# Patient Record
Sex: Female | Born: 1990 | Race: White | Hispanic: No | Marital: Married | Smoking: Never smoker
Health system: Southern US, Community
[De-identification: ages and names within clinical notes are randomized; demographics above are authoritative.]

## PROBLEM LIST (undated history)

## (undated) DIAGNOSIS — E282 Polycystic ovarian syndrome: Secondary | ICD-10-CM

## (undated) DIAGNOSIS — R7989 Other specified abnormal findings of blood chemistry: Secondary | ICD-10-CM

## (undated) DIAGNOSIS — T7840XA Allergy, unspecified, initial encounter: Secondary | ICD-10-CM

## (undated) DIAGNOSIS — N912 Amenorrhea, unspecified: Secondary | ICD-10-CM

## (undated) DIAGNOSIS — D352 Benign neoplasm of pituitary gland: Secondary | ICD-10-CM

## (undated) HISTORY — DX: Benign neoplasm of pituitary gland: D35.2

## (undated) HISTORY — PX: WISDOM TOOTH EXTRACTION: SHX21

## (undated) HISTORY — PX: NO PAST SURGERIES: SHX2092

## (undated) HISTORY — DX: Amenorrhea, unspecified: N91.2

## (undated) HISTORY — DX: Polycystic ovarian syndrome: E28.2

## (undated) HISTORY — DX: Allergy, unspecified, initial encounter: T78.40XA

## (undated) HISTORY — DX: Other specified abnormal findings of blood chemistry: R79.89

---

## 2020-03-01 ENCOUNTER — Other Ambulatory Visit: Payer: Self-pay

## 2020-03-01 ENCOUNTER — Other Ambulatory Visit (INDEPENDENT_AMBULATORY_CARE_PROVIDER_SITE_OTHER): Payer: BC Managed Care – PPO

## 2020-03-01 ENCOUNTER — Ambulatory Visit: Payer: BC Managed Care – PPO | Admitting: Certified Nurse Midwife

## 2020-03-01 ENCOUNTER — Encounter: Payer: Self-pay | Admitting: Certified Nurse Midwife

## 2020-03-01 VITALS — BP 121/80 | HR 69 | Ht 63.0 in | Wt 134.2 lb

## 2020-03-01 DIAGNOSIS — Z793 Long term (current) use of hormonal contraceptives: Secondary | ICD-10-CM

## 2020-03-01 DIAGNOSIS — L659 Nonscarring hair loss, unspecified: Secondary | ICD-10-CM

## 2020-03-01 DIAGNOSIS — N912 Amenorrhea, unspecified: Secondary | ICD-10-CM | POA: Diagnosis not present

## 2020-03-01 LAB — POCT URINE PREGNANCY: Preg Test, Ur: NEGATIVE

## 2020-03-01 NOTE — Progress Notes (Signed)
GYN ENCOUNTER NOTE  Subjective:       Vanessa Cordova is a 29 y.o. No obstetric history on file. female is here for gynecologic evaluation of the following issues:  1. Amenorrhea, she was on the pill since age 51 for heavy painful periods. She stopped in November because is planning to become pregnant in the future and wanted to establish a normal cycle. She denies any history of PCOS , endometriosis.  She has not had a period since stopping the birth control pill. She states she did not have a period while on the pill. She has noticed since stopping that she has had some hair loss, bloating,and  mood changes.    Gynecologic History No LMP recorded (lmp unknown). Contraception: none, was on the pill  Last Pap: 52yrs ago per pt Results were: normal Last mammogram: n/a .   Obstetric History OB History  Gravida Para Term Preterm AB Living  0 0 0 0 0 0  SAB TAB Ectopic Multiple Live Births  0 0 0 0 0    History reviewed. No pertinent past medical history.  History reviewed. No pertinent surgical history.  No current outpatient medications on file prior to visit.   No current facility-administered medications on file prior to visit.    Allergies  Allergen Reactions  . Codeine     Social History   Socioeconomic History  . Marital status: Married    Spouse name: Not on file  . Number of children: Not on file  . Years of education: Not on file  . Highest education level: Not on file  Occupational History  . Not on file  Tobacco Use  . Smoking status: Never Smoker  . Smokeless tobacco: Never Used  Substance and Sexual Activity  . Alcohol use: Not Currently  . Drug use: Not Currently  . Sexual activity: Yes    Birth control/protection: None  Other Topics Concern  . Not on file  Social History Narrative  . Not on file   Social Determinants of Health   Financial Resource Strain:   . Difficulty of Paying Living Expenses:   Food Insecurity:   . Worried About Community education officer in the Last Year:   . Barista in the Last Year:   Transportation Needs:   . Freight forwarder (Medical):   Marland Kitchen Lack of Transportation (Non-Medical):   Physical Activity:   . Days of Exercise per Week:   . Minutes of Exercise per Session:   Stress:   . Feeling of Stress :   Social Connections:   . Frequency of Communication with Friends and Family:   . Frequency of Social Gatherings with Friends and Family:   . Attends Religious Services:   . Active Member of Clubs or Organizations:   . Attends Banker Meetings:   Marland Kitchen Marital Status:   Intimate Partner Violence:   . Fear of Current or Ex-Partner:   . Emotionally Abused:   Marland Kitchen Physically Abused:   . Sexually Abused:     History reviewed. No pertinent family history.  The following portions of the patient's history were reviewed and updated as appropriate: allergies, current medications, past family history, past medical history, past social history, past surgical history and problem list.  Review of Systems Review of Systems - Negative except as noted in HPI Review of Systems - General ROS: negative for - chills, fatigue, fever, hot flashes, malaise or night sweats Hematological and Lymphatic ROS: negative for -  bleeding problems or swollen lymph nodes Gastrointestinal ROS: negative for - abdominal pain, blood in stools, change in bowel habits and nausea/vomiting Musculoskeletal ROS: negative for - joint pain, muscle pain or muscular weakness Genito-Urinary ROS: negative for - dysmenorrhea, dyspareunia, dysuria, genital discharge, genital ulcers, hematuria, incontinence, irregular/heavy menses, nocturia or pelvic pain.Positive for  change in menstrual cycle-amenorrhea  Objective:   BP 121/80   Pulse 69   Ht 5\' 3"  (1.6 m)   Wt 134 lb 4 oz (60.9 kg)   LMP  (LMP Unknown)   BMI 23.78 kg/m  CONSTITUTIONAL: Well-developed, well-nourished female in no acute distress.  HENT:  Normocephalic,  atraumatic.  NECK: Normal range of motion, supple, no masses.  Normal thyroid.  SKIN: Skin is warm and dry. No rash noted. Not diaphoretic. No erythema. No pallor. [place]: Alert and oriented to person, place, and time. PSYCHIATRIC: Normal mood and affect. Normal behavior. Normal judgment and thought content. CARDIOVASCULAR: pink RESPIRATORY:unlabored, no signs of distress BREASTS: Not Examined ABDOMEN: Soft, non distended; Non tender.  No Organomegaly. PELVIC:deferred  MUSCULOSKELETAL: Normal range of motion. No tenderness.  No cyanosis, clubbing, or edema.   Assessment:   1. Amenorrhea due to oral contraceptive - POCT urine pregnancy     Plan:  .  Discussed BC and return of normal menstrual cycle.Discussed it may take up to 1 yr for normal menses to occur . Reviewed blood work and ultrasound for evaluation of other potential causes of amenorrhea.  Discussed medications(provera challenge) to induce normal cycle if blood work & u/s WNL. She verbalizes and agrees to plan. Pt encouraged to return for annual exam and u/s at earliest convenience.  20 min time spent reviewing pt history, discussing possible causes of amenorrhea and discussion as listed above.   Philip Aspen, CNM

## 2020-03-01 NOTE — Patient Instructions (Signed)
Abnormal Uterine Bleeding °Abnormal uterine bleeding means bleeding more than usual from your uterus. It can include: °· Bleeding between periods. °· Bleeding after sex. °· Bleeding that is heavier than normal. °· Periods that last longer than usual. °· Bleeding after you have stopped having your period (menopause). °There are many problems that may cause this. You should see a doctor for any kind of bleeding that is not normal. Treatment depends on the cause of the bleeding. °Follow these instructions at home: °· Watch your condition for any changes. °· Do not use tampons, douche, or have sex, if your doctor tells you not to. °· Change your pads often. °· Get regular well-woman exams. Make sure they include a pelvic exam and cervical cancer screening. °· Keep all follow-up visits as told by your doctor. This is important. °Contact a doctor if: °· The bleeding lasts more than one week. °· You feel dizzy at times. °· You feel like you are going to throw up (nauseous). °· You throw up. °Get help right away if: °· You pass out. °· You have to change pads every hour. °· You have belly (abdominal) pain. °· You have a fever. °· You get sweaty. °· You get weak. °· You passing large blood clots from your vagina. °Summary °· Abnormal uterine bleeding means bleeding more than usual from your uterus. °· There are many problems that may cause this. You should see a doctor for any kind of bleeding that is not normal. °· Treatment depends on the cause of the bleeding. °This information is not intended to replace advice given to you by your health care provider. Make sure you discuss any questions you have with your health care provider. °Document Revised: 10/30/2016 Document Reviewed: 10/30/2016 °Elsevier Patient Education © 2020 Elsevier Inc. ° °

## 2020-03-02 ENCOUNTER — Other Ambulatory Visit: Payer: Self-pay | Admitting: Certified Nurse Midwife

## 2020-03-02 DIAGNOSIS — R7989 Other specified abnormal findings of blood chemistry: Secondary | ICD-10-CM

## 2020-03-02 LAB — TESTOSTERONE: Testosterone: 42 ng/dL (ref 8–48)

## 2020-03-02 LAB — FSH/LH
FSH: 7.7 m[IU]/mL
LH: 6.3 m[IU]/mL

## 2020-03-02 LAB — ESTRADIOL: Estradiol: 32.8 pg/mL

## 2020-03-02 LAB — SPECIMEN STATUS REPORT

## 2020-03-02 LAB — PROLACTIN: Prolactin: 57.1 ng/mL — ABNORMAL HIGH (ref 4.8–23.3)

## 2020-03-02 LAB — THYROID PANEL WITH TSH
Free Thyroxine Index: 2 (ref 1.2–4.9)
T3 Uptake Ratio: 26 % (ref 24–39)
T4, Total: 7.5 ug/dL (ref 4.5–12.0)
TSH: 1.39 u[IU]/mL (ref 0.450–4.500)

## 2020-03-07 ENCOUNTER — Telehealth: Payer: Self-pay | Admitting: Certified Nurse Midwife

## 2020-03-07 NOTE — Telephone Encounter (Signed)
Please let her know that I sent a my chart message with the results.   Thanks  Pattricia Boss

## 2020-03-07 NOTE — Telephone Encounter (Signed)
Patient called in wanting an update on her ultrasound results. Could you please advise?

## 2020-03-08 ENCOUNTER — Telehealth: Payer: Self-pay

## 2020-03-08 NOTE — Telephone Encounter (Signed)
Voicemail message let for patient- following up on the mychart message Doreene Burke CNM sent to her in regards to her message about U/S results.

## 2020-03-16 ENCOUNTER — Other Ambulatory Visit: Payer: Self-pay

## 2020-03-16 ENCOUNTER — Other Ambulatory Visit: Payer: BC Managed Care – PPO

## 2020-03-16 DIAGNOSIS — R7989 Other specified abnormal findings of blood chemistry: Secondary | ICD-10-CM

## 2020-03-17 LAB — PROLACTIN: Prolactin: 54.7 ng/mL — ABNORMAL HIGH (ref 4.8–23.3)

## 2020-03-20 ENCOUNTER — Other Ambulatory Visit: Payer: Self-pay | Admitting: Certified Nurse Midwife

## 2020-03-20 DIAGNOSIS — R7989 Other specified abnormal findings of blood chemistry: Secondary | ICD-10-CM

## 2020-03-20 NOTE — Progress Notes (Signed)
Orders placed for referral to endocrinology due to elevated prolactin level.   Doreene Burke , CNM

## 2020-03-30 ENCOUNTER — Other Ambulatory Visit (HOSPITAL_COMMUNITY)
Admission: RE | Admit: 2020-03-30 | Discharge: 2020-03-30 | Disposition: A | Discharge status: Home / Self Care | Payer: BC Managed Care – PPO | Source: Ambulatory Visit | Attending: Certified Nurse Midwife | Admitting: Certified Nurse Midwife

## 2020-03-30 ENCOUNTER — Encounter: Payer: Self-pay | Admitting: Certified Nurse Midwife

## 2020-03-30 ENCOUNTER — Other Ambulatory Visit: Payer: Self-pay

## 2020-03-30 ENCOUNTER — Ambulatory Visit (INDEPENDENT_AMBULATORY_CARE_PROVIDER_SITE_OTHER): Payer: BC Managed Care – PPO | Admitting: Certified Nurse Midwife

## 2020-03-30 VITALS — BP 112/76 | HR 78 | Ht 63.0 in | Wt 140.0 lb

## 2020-03-30 DIAGNOSIS — Z01419 Encounter for gynecological examination (general) (routine) without abnormal findings: Secondary | ICD-10-CM | POA: Diagnosis present

## 2020-03-30 DIAGNOSIS — Z124 Encounter for screening for malignant neoplasm of cervix: Secondary | ICD-10-CM | POA: Insufficient documentation

## 2020-03-30 DIAGNOSIS — Z136 Encounter for screening for cardiovascular disorders: Secondary | ICD-10-CM

## 2020-03-30 DIAGNOSIS — Z13220 Encounter for screening for lipoid disorders: Secondary | ICD-10-CM | POA: Diagnosis not present

## 2020-03-30 NOTE — Patient Instructions (Signed)
Preventive Care 21-27 Years Old, Female Preventive care refers to visits with your health care provider and lifestyle choices that can promote health and wellness. This includes:  A yearly physical exam. This may also be called an annual well check.  Regular dental visits and eye exams.  Immunizations.  Screening for certain conditions.  Healthy lifestyle choices, such as eating a healthy diet, getting regular exercise, not using drugs or products that contain nicotine and tobacco, and limiting alcohol use. What can I expect for my preventive care visit? Physical exam Your health care provider will check your:  Height and weight. This may be used to calculate body mass index (BMI), which tells if you are at a healthy weight.  Heart rate and blood pressure.  Skin for abnormal spots. Counseling Your health care provider may ask you questions about your:  Alcohol, tobacco, and drug use.  Emotional well-being.  Home and relationship well-being.  Sexual activity.  Eating habits.  Work and work environment.  Method of birth control.  Menstrual cycle.  Pregnancy history. What immunizations do I need?  Influenza (flu) vaccine  This is recommended every year. Tetanus, diphtheria, and pertussis (Tdap) vaccine  You may need a Td booster every 10 years. Varicella (chickenpox) vaccine  You may need this if you have not been vaccinated. Human papillomavirus (HPV) vaccine  If recommended by your health care provider, you may need three doses over 6 months. Measles, mumps, and rubella (MMR) vaccine  You may need at least one dose of MMR. You may also need a second dose. Meningococcal conjugate (MenACWY) vaccine  One dose is recommended if you are age 19-21 years and a first-year college student living in a residence hall, or if you have one of several medical conditions. You may also need additional booster doses. Pneumococcal conjugate (PCV13) vaccine  You may need  this if you have certain conditions and were not previously vaccinated. Pneumococcal polysaccharide (PPSV23) vaccine  You may need one or two doses if you smoke cigarettes or if you have certain conditions. Hepatitis A vaccine  You may need this if you have certain conditions or if you travel or work in places where you may be exposed to hepatitis A. Hepatitis B vaccine  You may need this if you have certain conditions or if you travel or work in places where you may be exposed to hepatitis B. Haemophilus influenzae type b (Hib) vaccine  You may need this if you have certain conditions. You may receive vaccines as individual doses or as more than one vaccine together in one shot (combination vaccines). Talk with your health care provider about the risks and benefits of combination vaccines. What tests do I need?  Blood tests  Lipid and cholesterol levels. These may be checked every 5 years starting at age 20.  Hepatitis C test.  Hepatitis B test. Screening  Diabetes screening. This is done by checking your blood sugar (glucose) after you have not eaten for a while (fasting).  Sexually transmitted disease (STD) testing.  BRCA-related cancer screening. This may be done if you have a family history of breast, ovarian, tubal, or peritoneal cancers.  Pelvic exam and Pap test. This may be done every 3 years starting at age 21. Starting at age 30, this may be done every 5 years if you have a Pap test in combination with an HPV test. Talk with your health care provider about your test results, treatment options, and if necessary, the need for more tests.   Follow these instructions at home: Eating and drinking   Eat a diet that includes fresh fruits and vegetables, whole grains, lean protein, and low-fat dairy.  Take vitamin and mineral supplements as recommended by your health care provider.  Do not drink alcohol if: ? Your health care provider tells you not to drink. ? You are  pregnant, may be pregnant, or are planning to become pregnant.  If you drink alcohol: ? Limit how much you have to 0-1 drink a day. ? Be aware of how much alcohol is in your drink. In the [place] one drink equals one 12 oz bottle of beer (355 mL), one 5 oz glass of wine (148 mL), or one 1 oz glass of hard liquor (44 mL). Lifestyle  Take daily care of your teeth and gums.  Stay active. Exercise for at least 30 minutes on 5 or more days each week.  Do not use any products that contain nicotine or tobacco, such as cigarettes, e-cigarettes, and chewing tobacco. If you need help quitting, ask your health care provider.  If you are sexually active, practice safe sex. Use a condom or other form of birth control (contraception) in order to prevent pregnancy and STIs (sexually transmitted infections). If you plan to become pregnant, see your health care provider for a preconception visit. What's next?  Visit your health care provider once a year for a well check visit.  Ask your health care provider how often you should have your eyes and teeth checked.  Stay up to date on all vaccines. This information is not intended to replace advice given to you by your health care provider. Make sure you discuss any questions you have with your health care provider. Document Revised: 07/17/2018 Document Reviewed: 07/17/2018 Elsevier Patient Education  2020 [employer].

## 2020-03-30 NOTE — Progress Notes (Signed)
     GYNECOLOGY ANNUAL PREVENTATIVE CARE ENCOUNTER NOTE  History:     Vanessa Cordova is a 29 y.o. G0P0000 female here for a routine annual gynecologic exam.  Current complaints: none.   Denies abnormal vaginal bleeding, discharge, pelvic pain, problems with intercourse or other gynecologic concerns.     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 Contraception: none Last Pap: 3 yrs ago per pt. Results were: normal Last mammogram: n/a  Obstetric History OB History  Gravida Para Term Preterm AB Living  0 0 0 0 0 0  SAB TAB Ectopic Multiple Live Births  0 0 0 0 0    No past medical history on file.  No past surgical history on file.  No current outpatient medications on file prior to visit.   No current facility-administered medications on file prior to visit.    Allergies  Allergen Reactions  . Codeine     Social History:  reports that she has never smoked. She has never used smokeless tobacco. She reports previous alcohol use. She reports previous drug use.  No family history on file.  The following portions of the patient's history were reviewed and updated as appropriate: allergies, current medications, past family history, past medical history, past social history, past surgical history and problem list.  Review of Systems Pertinent items noted in HPI and remainder of comprehensive ROS otherwise negative.  Physical Exam:  BP 112/76   Pulse 78   Ht 5\' 3"  (1.6 m)   Wt 140 lb (63.5 kg)   LMP  (LMP Unknown) Comment: dx with pcos-   BMI 24.80 kg/m  CONSTITUTIONAL: Well-developed, well-nourished female in no acute distress.  HENT:  Normocephalic, atraumatic, External right and left ear normal. Oropharynx is clear and moist EYES: Conjunctivae and EOM are normal. Pupils are equal, round, and reactive to light. No scleral  icterus.  NECK: Normal range of motion, supple, no masses.  Normal thyroid.  SKIN: Skin is warm and dry. No rash noted. Not diaphoretic. No erythema. No pallor. MUSCULOSKELETAL: Normal range of motion. No tenderness.  No cyanosis, clubbing, or edema.  2+ distal pulses. NEUROLOGIC: Alert and oriented to person, place, and time. Normal reflexes, muscle tone coordination.  PSYCHIATRIC: Normal mood and affect. Normal behavior. Normal judgment and thought content. CARDIOVASCULAR: Normal heart rate noted, regular rhythm RESPIRATORY: Clear to auscultation bilaterally. Effort and breath sounds normal, no problems with respiration noted. BREASTS: Symmetric in size. No masses, tenderness, skin changes, nipple drainage, or lymphadenopathy bilaterally. Performed in the presence of a chaperone. ABDOMEN: Soft, no distention noted.  No tenderness, rebound or guarding.  PELVIC: Normal appearing external genitalia and urethral meatus; normal appearing vaginal mucosa and cervix.  No abnormal discharge noted.  Pap smear obtained. Ectropion present, contact bleeding with pap smear.  Normal uterine size, no other palpable masses, no uterine or adnexal tenderness.  Performed in the presence of a chaperone.   Assessment and Plan:    1. Women's annual routine gynecological examination  Will follow up results of pap smear and manage accordingly. Mammogram not indicated Labs: lipid profile Referral: has appointment with endocrinology at end of this month for elevated prolactin Refills: none Routine preventative health maintenance measures emphasized. Please refer to After Visit Summary for other counseling recommendations.      , CNM

## 2020-03-31 LAB — LIPID PANEL
Chol/HDL Ratio: 2.6 ratio (ref 0.0–4.4)
Cholesterol, Total: 202 mg/dL — ABNORMAL HIGH (ref 100–199)
HDL: 78 mg/dL (ref >39)
LDL Chol Calc (NIH): 111 mg/dL — ABNORMAL HIGH (ref 0–99)
Triglycerides: 73 mg/dL (ref 0–149)
VLDL Cholesterol Cal: 13 mg/dL (ref 5–40)

## 2020-04-05 LAB — CYTOLOGY - PAP
Comment: NEGATIVE
High risk HPV: NEGATIVE

## 2020-04-12 ENCOUNTER — Other Ambulatory Visit: Payer: Self-pay

## 2020-04-13 ENCOUNTER — Encounter: Payer: Self-pay | Admitting: Obstetrics and Gynecology

## 2020-04-13 ENCOUNTER — Other Ambulatory Visit (HOSPITAL_COMMUNITY)
Admission: RE | Admit: 2020-04-13 | Discharge: 2020-04-13 | Disposition: A | Discharge status: Home / Self Care | Payer: BC Managed Care – PPO | Source: Ambulatory Visit | Attending: Obstetrics and Gynecology | Admitting: Obstetrics and Gynecology

## 2020-04-13 ENCOUNTER — Ambulatory Visit (INDEPENDENT_AMBULATORY_CARE_PROVIDER_SITE_OTHER): Payer: BC Managed Care – PPO | Admitting: Obstetrics and Gynecology

## 2020-04-13 ENCOUNTER — Other Ambulatory Visit: Payer: Self-pay

## 2020-04-13 VITALS — BP 130/86 | HR 71 | Ht 63.0 in | Wt 137.9 lb

## 2020-04-13 DIAGNOSIS — R87619 Unspecified abnormal cytological findings in specimens from cervix uteri: Secondary | ICD-10-CM | POA: Diagnosis present

## 2020-04-13 NOTE — Addendum Note (Signed)
Addended by: Dorian Pod on: 04/13/2020 09:16 AM   Modules accepted: Orders

## 2020-04-13 NOTE — Progress Notes (Signed)
Referring Provider: Doreene Burke  HPI:  Vanessa Cordova is a 29 y.o.  G0P0000  who presents today for evaluation and management of abnormal cervical cytology.   Patient has also been been noted to be hyperprolactinemia.  She has irregular or no menstrual bleeding when off of OCPs.  Her PCO work-up was significant for elevated prolactin levels.  Doreene Burke has referred her to endocrinology.  Patient and I talked about these levels in some detail and her lack of ovulation in light of the fact that she is considering pregnancy in the near future.  Dysplasia History:  .  Atypical glandular cells  ROS:  Pertinent items noted in HPI and remainder of comprehensive ROS otherwise negative.     OB History  Gravida Para Term Preterm AB Living  0 0 0 0 0 0  SAB TAB Ectopic Multiple Live Births  0 0 0 0 0    Past Medical History:  Diagnosis Date  . Allergies   . PCOS (polycystic ovarian syndrome)     Past Surgical History:  Procedure Laterality Date  . NO PAST SURGERIES      SOCIAL HISTORY: Social History   Substance and Sexual Activity  Alcohol Use Not Currently   Social History   Substance and Sexual Activity  Drug Use Not Currently     Family History  Problem Relation Age of Onset  . Breast cancer Neg Hx   . Ovarian cancer Neg Hx   . Colon cancer Neg Hx   . Diabetes Neg Hx     ALLERGIES:  Codeine  She has a current medication list which includes the following prescription(s): fexofenadine and probiotic product.  Physical Exam: -Vitals:  BP 130/86   Pulse 71   Ht 5\' 3"  (1.6 m)   Wt 137 lb 14.4 oz (62.6 kg)   LMP  (LMP Unknown) Comment: dx with pcos-   BMI 24.43 kg/m   PROCEDURE: Colposcopy performed with 4% acetic acid after verbal consent obtained                           -Aceto-white Lesions Location(s): 4 o'clock.              -Biopsy performed at 4 o'clock               -ECC indicated and performed: Yes.       -Biopsy sites made hemostatic with  pressure and Monsel's solution   -Satisfactory colposcopy: Yes.      -Evidence of Invasive cervical CA :  NO  ASSESSMENT:  Vanessa Cordova is a 29 y.o. G0P0000 here for  1. 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  PLAN: 1.  I discussed the grading system of pap smears and HPV high risk viral types.  We will discuss management after colpo results return.  No orders of the defined types were placed in this encounter.          F/U  Return in about 2 weeks (around 04/27/2020) for Colpo f/u.  06/27/2020 ,MD 04/13/2020,8:51 AM

## 2020-04-14 ENCOUNTER — Ambulatory Visit: Payer: BC Managed Care – PPO | Admitting: Internal Medicine

## 2020-04-14 ENCOUNTER — Encounter: Payer: Self-pay | Admitting: Internal Medicine

## 2020-04-14 ENCOUNTER — Other Ambulatory Visit: Payer: Self-pay

## 2020-04-14 VITALS — BP 114/82 | HR 60 | Temp 98.2°F | Ht 63.0 in | Wt 138.6 lb

## 2020-04-14 DIAGNOSIS — E221 Hyperprolactinemia: Secondary | ICD-10-CM | POA: Insufficient documentation

## 2020-04-14 LAB — CORTISOL: Cortisol, Plasma: 6.1 ug/dL

## 2020-04-14 NOTE — Progress Notes (Signed)
Name: Vanessa Cordova  MRN/ DOB: [id number], 10-22-1991    Age/ Sex: 29 y.o., female    PCP: [facility] [place], NP   Reason for Endocrinology Evaluation: Elevated prolactin level      Date of Initial Endocrinology Evaluation: 04/14/2020     HPI: [name] is a 29 y.o. female with unremarkable  past medical history . The patient presented for initial endocrinology clinic visit on 04/14/2020 for consultative assistance with her elevated prolactin level   Pt presented to [facility] for a routine physician when she was noted to  have elevated prolactin levels at 57.1 ng/mL , elevated levels confirmed by repeat testing.   Menarche at age 17 . She was started on OCP;s at age 82  due to heavy menstruation and dysmenorrhea , which was continued until 08/2019  Pt denies any menstruation since discontinuation of OCP's   She is planning on conceiving in the near future   Had noted occasional milky discharge only upon nipple stimulation   Denies headaches or vision changes   She is on MVI and probiotics.  No prior use of any antipsychotic medications   No FH of endocrine disease   HISTORY:  Past Medical History:  Past Medical History:  Diagnosis Date  . Allergies   . PCOS (polycystic ovarian syndrome)     Past Surgical History:  Past Surgical History:  Procedure Laterality Date  . NO PAST SURGERIES        Social History:  reports that she has never smoked. She has never used smokeless tobacco. She reports previous alcohol use. She reports previous drug use.  Family History: family history is not on file.   HOME MEDICATIONS: Allergies as of 04/14/2020      Reactions   Codeine       Medication List       Accurate as of Apr 14, 2020  8:57 AM. If you have any questions, ask your nurse or doctor.        fexofenadine 60 MG tablet Commonly known as: ALLEGRA Take 60 mg by mouth 2 (two) times daily.   PROBIOTIC-10 PO Take by mouth.         REVIEW OF  SYSTEMS: A comprehensive ROS was conducted with the patient and is negative except as per HPI    OBJECTIVE:  VS: BP 114/82 (BP Location: Left Arm, Patient Position: Sitting, Cuff Size: Normal)   Pulse 60   Temp 98.2 F (36.8 C)   Ht 5\' 3"  (1.6 m)   Wt 138 lb 9.6 oz (62.9 kg)   LMP  (LMP Unknown) Comment: dx with pcos-   SpO2 96%   BMI 24.55 kg/m    Wt Readings from Last 3 Encounters:  04/14/20 138 lb 9.6 oz (62.9 kg)  04/13/20 137 lb 14.4 oz (62.6 kg)  03/30/20 140 lb (63.5 kg)     EXAM: General: Pt appears well and is in NAD  Eyes: External eye exam normal without stare, lid lag or exophthalmos.  EOM intact. Confrontational testing- normal   Neck: General: Supple without adenopathy. Thyroid: Thyroid size normal.  No goiter or nodules appreciated. No thyroid bruit.  Lungs: Clear with good BS bilat with no rales, rhonchi, or wheezes  Heart: Auscultation: RRR.  Abdomen: Normoactive bowel sounds, soft, nontender, without masses or organomegaly palpable  Extremities:  BL LE: No pretibial edema normal ROM and strength.  Skin: Hair: Texture and amount normal with gender appropriate distribution Skin Inspection: No rashes Skin  Palpation: Skin temperature, texture, and thickness normal to palpation  Neuro: Cranial nerves: II - XII grossly intact  Motor: Normal strength throughout DTRs: 2+ and symmetric in UE without delay in relaxation phase  Mental Status: Judgment, insight: Intact Orientation: Oriented to time, place, and person Mood and affect: No depression, anxiety, or agitation     DATA REVIEWED:   Results for JOHARA, LODWICK (MRN [id number]) as of 04/19/2020 08:16  Ref. Range 04/14/2020 09:35  PROLACTIN,UNDILUTED Latest Ref Range: 2.0 - 30.0 ng/mL 36.2 (H)   Results for TRINTY, MARKEN (MRN [id number]) as of 04/19/2020 08:16  Ref. Range 04/14/2020 09:35  Cortisol, Plasma Latest Units: ug/dL 6.1   Results for STARLYN, DROGE (MRN [id number]) as of 04/19/2020 08:16  Ref.  Range 03/01/2020 10:22  LH Latest Units: mIU/mL 6.3  FSH Latest Units: mIU/mL 7.7  Prolactin Latest Ref Range: 4.8 - 23.3 ng/mL 57.1 (H)  Estradiol Latest Units: pg/mL 32.8  Testosterone Latest Ref Range: 8 - 48 ng/dL 42  TSH Latest Ref Range: 0.450 - 4.500 uIU/mL 1.390  Thyroxine (T4) Latest Ref Range: 4.5 - 12.0 ug/dL 7.5  Free Thyroxine Index Latest Ref Range: 1.2 - 4.9  2.0  T3 Uptake Ratio Latest Ref Range: 24 - 39 % 26   Pelvic ultrasound 03/01/2020  .Multiple follicles of small size are seen in both of the ovaries. Most of the follicles are less than 10 mm. in size . 2.Survey of the adnexa demonstrates no adnexal masses.  ASSESSMENT/PLAN/RECOMMENDATIONS:   1. Hyperprolactinemia :  - We discussed pituitary adenoma vs idiopathic hyperprolactinemia as a differential  - In review of her records, LH, FSH and TFT's have been normal - Cortisol level is normal as well, ACTH is pending  - Will also proceed with pituitary imaging  - We discussed treatment options with cabergoline and bromocriptine - Repeat prolactin level is trending down from 57 to 36.2 ng/mL    F/U in 3 months   Signed electronically by: Lyndle Herrlich, MD  [facility] Endocrinology  [facility] [address] [place] [place], [place] [address] Phone: [phone] FAX: [phone]   CC: [facility], Phineas Real A[place], NP [address] [place] [address] Phone: [phone] Fax: [phone]   Return to Endocrinology clinic as below: Future Appointments  Date Time Provider Department Center  05/04/2020  8:45 AM Linzie Collin, MD [facility] None

## 2020-04-19 LAB — PROLACTIN W/DILUTION: PROLACTIN,UNDILUTED: 36.2 ng/mL — ABNORMAL HIGH (ref 2.0–30.0)

## 2020-04-19 LAB — ACTH: C206 ACTH: 7 pg/mL (ref 6–50)

## 2020-04-19 LAB — HCG, SERUM, QUALITATIVE: Preg, Serum: NEGATIVE

## 2020-04-20 LAB — SURGICAL PATHOLOGY

## 2020-05-04 ENCOUNTER — Other Ambulatory Visit: Payer: Self-pay

## 2020-05-04 ENCOUNTER — Ambulatory Visit (INDEPENDENT_AMBULATORY_CARE_PROVIDER_SITE_OTHER): Payer: BC Managed Care – PPO | Admitting: Obstetrics and Gynecology

## 2020-05-04 ENCOUNTER — Encounter: Payer: Self-pay | Admitting: Obstetrics and Gynecology

## 2020-05-04 VITALS — BP 123/79 | HR 98 | Ht 63.0 in | Wt 139.5 lb

## 2020-05-04 DIAGNOSIS — E221 Hyperprolactinemia: Secondary | ICD-10-CM | POA: Diagnosis not present

## 2020-05-04 DIAGNOSIS — N870 Mild cervical dysplasia: Secondary | ICD-10-CM

## 2020-05-04 NOTE — Progress Notes (Signed)
HPI:      [name] is a 29 y.o. G0P0000 who LMP was No LMP recorded (lmp unknown).  Subjective:   She presents today for follow-up of colposcopy.  Patient previously had atypical glandular cells.  Colposcopy reveals no issues with endocervical curettage.  CIN-1 diagnosis of exocervix.  (Patient has some concerns regarding this as she states that her only sexual partner has been her husband and his only sexual partner has been her) Patient also being followed by endocrinology for elevated prolactin.  She has an MRI scheduled (?).  This likely is causing her irregular menstrual cycles.  She is not currently using anything for birth control but does not desire pregnancy.    Hx: The following portions of the patient's history were reviewed and updated as appropriate:             She  has a past medical history of Allergies and PCOS (polycystic ovarian syndrome). She does not have any pertinent problems on file. She  has a past surgical history that includes No past surgeries. Her family history is not on file. She  reports that she has never smoked. She has never used smokeless tobacco. She reports previous alcohol use. She reports previous drug use. She has a current medication list which includes the following prescription(s): fexofenadine, multivitamin, and probiotic product. She is allergic to codeine.       Review of Systems:  Review of Systems  Constitutional: Denied constitutional symptoms, night sweats, recent illness, fatigue, fever, insomnia and weight loss.  Eyes: Denied eye symptoms, eye pain, photophobia, vision change and visual disturbance.  Ears/Nose/Throat/Neck: Denied ear, nose, throat or neck symptoms, hearing loss, nasal discharge, sinus congestion and sore throat.  Cardiovascular: Denied cardiovascular symptoms, arrhythmia, chest pain/pressure, edema, exercise intolerance, orthopnea and palpitations.  Respiratory: Denied pulmonary symptoms, asthma, pleuritic pain,  productive sputum, cough, dyspnea and wheezing.  Gastrointestinal: Denied, gastro-esophageal reflux, melena, nausea and vomiting.  Genitourinary: Denied genitourinary symptoms including symptomatic vaginal discharge, pelvic relaxation issues, and urinary complaints.  Musculoskeletal: Denied musculoskeletal symptoms, stiffness, swelling, muscle weakness and myalgia.  Dermatologic: Denied dermatology symptoms, rash and scar.  Neurologic: Denied neurology symptoms, dizziness, headache, neck pain and syncope.  Psychiatric: Denied psychiatric symptoms, anxiety and depression.  Endocrine: Denied endocrine symptoms including hot flashes and night sweats.   Meds:   Current Outpatient Medications on File Prior to Visit  Medication Sig Dispense Refill  . fexofenadine (ALLEGRA) 60 MG tablet Take 60 mg by mouth 2 (two) times daily.    . Multiple Vitamin (MULTIVITAMIN) tablet Take 1 tablet by mouth daily.    . Probiotic Product (PROBIOTIC-10 PO) Take by mouth.     No current facility-administered medications on file prior to visit.    Objective:     Vitals:   05/04/20 0850  BP: 123/79  Pulse: 98              CIN-1 by colposcopically directed biopsies-no evidence of glandular issue.  Assessment:    G0P0000 Patient Active Problem List   Diagnosis Date Noted  . Hyperprolactinemia ([place]) 04/14/2020     1. CIN I (cervical intraepithelial neoplasia I)   2. Hyperprolactinemia ([place])     History of atypical glandular cells of undetermined significance.   Plan:            1.  Because of the atypical glandular cells I recommend a follow-up Pap smear in 6 months that should include exo and endocervical specimens sent separately.  If  endocervix proves negative would follow exocervical CIN-1 per usual protocol.  I discussed this in detail with the patient. Natural course and history of HPV discussed in detail.  Typical sexually transmitted nature of HPV discussed.  Association of HPV and CIN  discussed.  2.  Elevated prolactin discussed.  Use of cabergoline as treatment discussed.  I question the necessity of MRI at such a low level of prolactin.  Macroadenomas visible to MRI are usually significantly higher than 100.  I have informed her that I am not an endocrinologist but this is something I see regularly for patients with infertility and irregular cycles. Orders No orders of the defined types were placed in this encounter.   No orders of the defined types were placed in this encounter.     F/U  Return in about 6 months (around 11/03/2020). I spent 26 minutes involved in the care of this patient preparing to see the patient by obtaining and reviewing her medical history (including labs, imaging tests and prior procedures), documenting clinical information in the electronic health record (EHR), counseling and coordinating care plans, writing and sending prescriptions, ordering tests or procedures and directly communicating with the patient by discussing pertinent items from her history and physical exam as well as detailing my assessment and plan as noted above so that she has an informed understanding.  All of her questions were answered.  Elonda Husky, M.D. 05/04/2020 9:57 AM

## 2020-05-16 ENCOUNTER — Ambulatory Visit
Admission: RE | Admit: 2020-05-16 | Discharge: 2020-05-16 | Disposition: A | Discharge status: Home / Self Care | Payer: BC Managed Care – PPO | Source: Ambulatory Visit | Attending: Internal Medicine | Admitting: Internal Medicine

## 2020-05-16 ENCOUNTER — Other Ambulatory Visit: Payer: Self-pay

## 2020-05-16 ENCOUNTER — Telehealth: Payer: Self-pay | Admitting: Internal Medicine

## 2020-05-16 DIAGNOSIS — E221 Hyperprolactinemia: Secondary | ICD-10-CM

## 2020-05-16 MED ORDER — GADOBENATE DIMEGLUMINE 529 MG/ML IV SOLN
6.0000 mL | Freq: Once | INTRAVENOUS | Status: AC | PRN
  Administered 2020-05-16: 6 mL via INTRAVENOUS

## 2020-05-16 MED ORDER — CABERGOLINE 0.5 MG PO TABS: 0.2500 mg | ORAL_TABLET | ORAL | 1 refills | Status: DC

## 2020-05-16 NOTE — Telephone Encounter (Signed)
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  Will start cabergoline 0.5 , half a tablet twice weekly     Cautioned against GI side effects   F/U in august     Pt expressed understanding    Abby Raelyn Mora, MD  [facility] Endocrinology  [facility] [address] [place], [place] [address] Phone: [phone] FAX: [phone]

## 2020-06-15 ENCOUNTER — Encounter: Payer: Self-pay | Admitting: Internal Medicine

## 2020-06-16 ENCOUNTER — Encounter: Payer: Self-pay | Admitting: Obstetrics and Gynecology

## 2020-06-16 ENCOUNTER — Telehealth: Payer: Self-pay | Admitting: Obstetrics and Gynecology

## 2020-06-16 NOTE — Telephone Encounter (Signed)
Spoke with patient and let her know that she would need to contact her endocrinologist to discuss since they prescribed the medication.  Patient has already contacted their office.

## 2020-06-16 NOTE — Telephone Encounter (Signed)
   Patient called in stating that she thinks she is having some side effects from the medication that was prescribed to her by the endocrinologist that she saw. Patient states she was at dinner and she started having really bad cramping and she could feel herself getting extremely hot and sweaty. Patient states she felt like she was going to pass out. Patient would like to know what is advised for her to do. Patient is going out of town today and would like to know if she would need to be seen before leaving. Rx is cabergoline.   Informed patient I would send a message to her provider but that did have 24-48 hours to respond as they are in clinic with other patients. Patient verbalized understanding.

## 2020-06-29 ENCOUNTER — Telehealth: Payer: Self-pay | Admitting: Obstetrics and Gynecology

## 2020-06-29 NOTE — Telephone Encounter (Signed)
Called pt and move appt up. Pt was okay with moving up appt.

## 2020-06-29 NOTE — Telephone Encounter (Signed)
Pt called in and stated that she wants to come in for a pregnancy  Confirmation  The pt stated she hasn't had a period in over 10 years, and she took home pregnancy  test. I told the pt the next appt I have was 8/18 the pt wanted to be seen sooner. I told the pt I will place you here but I will have to send a message and if that's not okay I will have to move the appt. The pt verbally understood

## 2020-06-29 NOTE — Telephone Encounter (Signed)
You can move her up to come in at 11:30 tomorrow.

## 2020-06-30 ENCOUNTER — Other Ambulatory Visit: Payer: Self-pay

## 2020-06-30 ENCOUNTER — Ambulatory Visit: Payer: BC Managed Care – PPO | Admitting: Obstetrics and Gynecology

## 2020-06-30 ENCOUNTER — Ambulatory Visit (INDEPENDENT_AMBULATORY_CARE_PROVIDER_SITE_OTHER): Payer: BC Managed Care – PPO

## 2020-06-30 ENCOUNTER — Encounter: Payer: Self-pay | Admitting: Obstetrics and Gynecology

## 2020-06-30 VITALS — BP 113/80 | HR 86 | Ht 63.0 in | Wt 136.8 lb

## 2020-06-30 DIAGNOSIS — N912 Amenorrhea, unspecified: Secondary | ICD-10-CM | POA: Diagnosis not present

## 2020-06-30 DIAGNOSIS — Z3A01 Less than 8 weeks gestation of pregnancy: Secondary | ICD-10-CM | POA: Diagnosis not present

## 2020-06-30 LAB — POCT URINE PREGNANCY: Preg Test, Ur: POSITIVE — AB

## 2020-06-30 NOTE — Progress Notes (Signed)
HPI:      [name] is a 29 y.o. G1P0000 who LMP was No LMP recorded (lmp unknown). Patient is pregnant.  Subjective:   She presents today because she has missed a menstrual period and has had a positive home pregnancy test.  She recently was begun on cabergoline by her endocrinologist for elevated prolactin and a pituitary microadenoma and has not yet had regular menses. She is excited about being pregnant. She has not yet begun prenatal vitamins.    Hx: The following portions of the patient's history were reviewed and updated as appropriate:             She  has a past medical history of Allergies and PCOS (polycystic ovarian syndrome). She does not have any pertinent problems on file. She  has a past surgical history that includes No past surgeries. Her family history is not on file. She  reports that she has never smoked. She has never used smokeless tobacco. She reports previous alcohol use. She reports previous drug use. She has a current medication list which includes the following prescription(s): cabergoline, fexofenadine, multivitamin, and probiotic product. She is allergic to codeine.       Review of Systems:  Review of Systems  Constitutional: Denied constitutional symptoms, night sweats, recent illness, fatigue, fever, insomnia and weight loss.  Eyes: Denied eye symptoms, eye pain, photophobia, vision change and visual disturbance.  Ears/Nose/Throat/Neck: Denied ear, nose, throat or neck symptoms, hearing loss, nasal discharge, sinus congestion and sore throat.  Cardiovascular: Denied cardiovascular symptoms, arrhythmia, chest pain/pressure, edema, exercise intolerance, orthopnea and palpitations.  Respiratory: Denied pulmonary symptoms, asthma, pleuritic pain, productive sputum, cough, dyspnea and wheezing.  Gastrointestinal: Denied, gastro-esophageal reflux, melena, nausea and vomiting.  Genitourinary: Denied genitourinary symptoms including symptomatic vaginal  discharge, pelvic relaxation issues, and urinary complaints.  Musculoskeletal: Denied musculoskeletal symptoms, stiffness, swelling, muscle weakness and myalgia.  Dermatologic: Denied dermatology symptoms, rash and scar.  Neurologic: Denied neurology symptoms, dizziness, headache, neck pain and syncope.  Psychiatric: Denied psychiatric symptoms, anxiety and depression.  Endocrine: Denied endocrine symptoms including hot flashes and night sweats.   Meds:   Current Outpatient Medications on File Prior to Visit  Medication Sig Dispense Refill   cabergoline (DOSTINEX) 0.5 MG tablet Take 0.5 tablets (0.25 mg total) by mouth 2 (two) times a week. 13 tablet 1   fexofenadine (ALLEGRA) 60 MG tablet Take 60 mg by mouth 2 (two) times daily. (Patient not taking: Reported on 06/30/2020)     Multiple Vitamin (MULTIVITAMIN) tablet Take 1 tablet by mouth daily. (Patient not taking: Reported on 06/30/2020)     Probiotic Product (PROBIOTIC-10 PO) Take by mouth. (Patient not taking: Reported on 06/30/2020)     No current facility-administered medications on file prior to visit.    Objective:     Vitals:   06/30/20 1132  BP: 113/80  Pulse: 86              Urinary pregnancy test positive  Assessment:    G1P0000 Patient Active Problem List   Diagnosis Date Noted   Hyperprolactinemia ([facility]) 04/14/2020     1. Amenorrhea     Unknown gestational age because patient not having regular cycles.   Plan:            Prenatal Plan 1.  The patient was given prenatal literature. 2.  She was begun on prenatal vitamins. 3.  A prenatal lab panel was ordered or drawn. 4.  An ultrasound was ordered to better  determine an EDC. 5.  A nurse visit was scheduled. 6.  Genetic testing and testing for other inheritable conditions discussed in detail. She will decide in the future whether to have these labs performed. 7.  A general overview of pregnancy testing, visit schedule, ultrasound schedule, and prenatal  care was discussed. 8.  COVID and its risks associated with pregnancy, prevention by limiting exposure and use of masks, as well as the risks and benefits of vaccination during pregnancy were discussed in detail.  [facility] policy regarding office and hospital visitation and testing was explained. 9.  Benefits of breast-feeding discussed in detail including both maternal and infant benefits. Ready Set Baby website discussed. 10.  Literature regarding nausea and vomiting of pregnancy given. 11.  Advised discontinuation of cabergoline during pregnancy -patient will see endocrinologist next week and make a final decision. 12.  Recommend follow-up Pap smear (history of atypical glandular cells and positive HPV) at new OB visit.   Orders Orders Placed This Encounter  Procedures   US OB Comp Less 14 Wks   US OB Transvaginal   POCT urine pregnancy    No orders of the defined types were placed in this encounter.     F/U  Return in about 6 weeks (around 08/11/2020). I spent 23 minutes involved in the care of this patient preparing to see the patient by obtaining and reviewing her medical history (including labs, imaging tests and prior procedures), documenting clinical information in the electronic health record (EHR), counseling and coordinating care plans, writing and sending prescriptions, ordering tests or procedures and directly communicating with the patient by discussing pertinent items from her history and physical exam as well as detailing my assessment and plan as noted above so that she has an informed understanding.  All of her questions were answered.  Elonda Husky, M.D. 06/30/2020 12:16 PM

## 2020-07-08 ENCOUNTER — Ambulatory Visit: Payer: BC Managed Care – PPO | Admitting: Internal Medicine

## 2020-07-19 ENCOUNTER — Telehealth: Payer: Self-pay

## 2020-07-19 NOTE — Telephone Encounter (Signed)
mychart message sent to patient

## 2020-07-20 ENCOUNTER — Telehealth: Payer: Self-pay

## 2020-07-20 NOTE — Telephone Encounter (Signed)
mychart message sent to patient

## 2020-07-21 ENCOUNTER — Other Ambulatory Visit: Payer: Self-pay

## 2020-07-21 ENCOUNTER — Ambulatory Visit: Payer: BC Managed Care – PPO

## 2020-07-21 ENCOUNTER — Other Ambulatory Visit: Payer: BC Managed Care – PPO

## 2020-07-21 ENCOUNTER — Other Ambulatory Visit: Payer: Self-pay | Admitting: Obstetrics and Gynecology

## 2020-07-21 ENCOUNTER — Ambulatory Visit (INDEPENDENT_AMBULATORY_CARE_PROVIDER_SITE_OTHER): Payer: BC Managed Care – PPO

## 2020-07-21 VITALS — BP 119/80 | HR 91 | Wt 136.4 lb

## 2020-07-21 DIAGNOSIS — N912 Amenorrhea, unspecified: Secondary | ICD-10-CM

## 2020-07-21 DIAGNOSIS — Z3A09 9 weeks gestation of pregnancy: Secondary | ICD-10-CM

## 2020-07-21 LAB — OB RESULTS CONSOLE VARICELLA ZOSTER ANTIBODY, IGG: Varicella: IMMUNE

## 2020-07-21 NOTE — Progress Notes (Signed)
Vanessa Cordova presents for NOB nurse interview visit. Pregnancy confirmation done 06/30/20 Dr Logan Bores. Unsure of LMP. U/S today 07/21/20- 9 weeks 5 days  G-1 P 0 0 0 0. Pregnancy education material explained and given. 0 cats in the home. NOB labs ordered. HIV labs and Drug screen were explained optional and she declined. PNV encouraged. Genetic screening options discussed. Genetic testing: Ordered. Pt. To follow up with provider in 2-3  weeks for NOB physical.  All questions answered. FMLA paper work Medical illustrator. Financial policy reviewed and understood.

## 2020-07-22 LAB — URINALYSIS, ROUTINE W REFLEX MICROSCOPIC
Bilirubin, UA: NEGATIVE
Glucose, UA: NEGATIVE
Ketones, UA: NEGATIVE
Leukocytes,UA: NEGATIVE
Nitrite, UA: NEGATIVE
Protein,UA: NEGATIVE
RBC, UA: NEGATIVE
Specific Gravity, UA: 1.027 (ref 1.005–1.030)
Urobilinogen, Ur: 0.2 mg/dL (ref 0.2–1.0)
pH, UA: 6 (ref 5.0–7.5)

## 2020-07-22 LAB — ANTIBODY SCREEN: Antibody Screen: NEGATIVE

## 2020-07-22 LAB — CBC WITH DIFFERENTIAL
Basophils Absolute: 0 10*3/uL (ref 0.0–0.2)
Basos: 0 %
EOS (ABSOLUTE): 0 10*3/uL (ref 0.0–0.4)
Eos: 0 %
Hematocrit: 39.7 % (ref 34.0–46.6)
Hemoglobin: 13.2 g/dL (ref 11.1–15.9)
Immature Grans (Abs): 0 10*3/uL (ref 0.0–0.1)
Immature Granulocytes: 0 %
Lymphocytes Absolute: 2.8 10*3/uL (ref 0.7–3.1)
Lymphs: 26 %
MCH: 31.2 pg (ref 26.6–33.0)
MCHC: 33.2 g/dL (ref 31.5–35.7)
MCV: 94 fL (ref 79–97)
Monocytes Absolute: 0.5 10*3/uL (ref 0.1–0.9)
Monocytes: 5 %
Neutrophils Absolute: 7.4 10*3/uL — ABNORMAL HIGH (ref 1.4–7.0)
Neutrophils: 69 %
RBC: 4.23 x10E6/uL (ref 3.77–5.28)
RDW: 12.1 % (ref 11.7–15.4)
WBC: 10.9 10*3/uL — ABNORMAL HIGH (ref 3.4–10.8)

## 2020-07-22 LAB — RUBELLA SCREEN: Rubella Antibodies, IGG: 5.75 index (ref >0.99)

## 2020-07-22 LAB — TOXOPLASMA ANTIBODIES- IGG AND  IGM
Toxoplasma Antibody- IgM: <3 AU/mL (ref 0.0–7.9)
Toxoplasma IgG Ratio: <3 IU/mL (ref 0.0–7.1)

## 2020-07-22 LAB — VARICELLA ZOSTER ANTIBODY, IGG: Varicella zoster IgG: 1688 index (ref >165)

## 2020-07-22 LAB — HEPATITIS B SURFACE ANTIGEN: Hepatitis B Surface Ag: NEGATIVE

## 2020-07-22 LAB — ABO AND RH: Rh Factor: POSITIVE

## 2020-07-22 LAB — RPR: RPR Ser Ql: NONREACTIVE

## 2020-07-23 LAB — GC/CHLAMYDIA PROBE AMP
Chlamydia trachomatis, NAA: NEGATIVE
Neisseria Gonorrhoeae by PCR: NEGATIVE

## 2020-07-23 LAB — URINE CULTURE

## 2020-07-26 LAB — MATERNIT 21 PLUS CORE, BLOOD
Fetal Fraction: 11
Result (T21): NEGATIVE
Trisomy 13 (Patau syndrome): NEGATIVE
Trisomy 18 (Edwards syndrome): NEGATIVE
Trisomy 21 (Down syndrome): NEGATIVE

## 2020-08-04 ENCOUNTER — Telehealth: Payer: Self-pay

## 2020-08-04 NOTE — Telephone Encounter (Signed)
mychart message sent to patient

## 2020-08-10 ENCOUNTER — Other Ambulatory Visit (HOSPITAL_COMMUNITY)
Admission: RE | Admit: 2020-08-10 | Discharge: 2020-08-10 | Disposition: A | Discharge status: Home / Self Care | Payer: BC Managed Care – PPO | Source: Ambulatory Visit | Attending: Obstetrics and Gynecology | Admitting: Obstetrics and Gynecology

## 2020-08-10 ENCOUNTER — Ambulatory Visit (INDEPENDENT_AMBULATORY_CARE_PROVIDER_SITE_OTHER): Payer: BC Managed Care – PPO | Admitting: Obstetrics and Gynecology

## 2020-08-10 ENCOUNTER — Other Ambulatory Visit: Payer: Self-pay

## 2020-08-10 ENCOUNTER — Encounter: Payer: Self-pay | Admitting: Obstetrics and Gynecology

## 2020-08-10 VITALS — BP 113/78 | HR 88 | Wt 136.6 lb

## 2020-08-10 DIAGNOSIS — Z3401 Encounter for supervision of normal first pregnancy, first trimester: Secondary | ICD-10-CM | POA: Diagnosis present

## 2020-08-10 DIAGNOSIS — Z3A12 12 weeks gestation of pregnancy: Secondary | ICD-10-CM

## 2020-08-10 DIAGNOSIS — Z8742 Personal history of other diseases of the female genital tract: Secondary | ICD-10-CM | POA: Insufficient documentation

## 2020-08-10 NOTE — Progress Notes (Signed)
NOB: 1 time daily nausea and vomiting but mostly keeping foods and liquids down the rest of the day.  Normal MaterniT 21.  aFP next visit. 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  Normo-cephalic. Pupils reactive. Anicteric sclerae  Thyroid/Neck Smooth without nodularity or enlargement. Normal ROM.  Neck Supple.  Skin No rashes, lesions or ulceration. Normal palpated skin turgor. No nodularity.  Breasts: No masses or discharge.  Symmetric.  No axillary adenopathy.  Lungs: Clear to auscultation.No rales or wheezes. Normal Respiratory effort, no retractions.  Heart: NSR.  No murmurs or rubs appreciated. No periferal edema  Abdomen: Soft.  Non-tender.  No masses.  No HSM. No hernia  Extremities: Moves all appropriately.  Normal ROM for age. No lymphadenopathy.  Neuro: Oriented to PPT.  Normal mood. Normal affect.     Pelvic:   Vulva: Normal appearance.  No lesions.  Vagina: No lesions or abnormalities noted.  Support: Normal pelvic support.  Urethra No masses tenderness or scarring.  Meatus Normal size without lesions or prolapse.  Cervix: Normal appearance.  No lesions.  Anus: Normal exam.  No lesions.  Perineum: Normal exam.  No lesions.        Bimanual   Adnexae: No masses.  Non-tender to palpation.  Uterus: Enlarged. 12wks pos FHTs  Non-tender.  Mobile.  AV.  Adnexae: No masses.  Non-tender to palpation.  Cul-de-sac: Negative for abnormality.  Adnexae: No masses.  Non-tender to palpation.         Pelvimetry   Diagonal: Reached.  Spines: Average.  Sacrum: Concave.  Pubic Arch: Normal.

## 2020-08-10 NOTE — Addendum Note (Signed)
Addended by: Dorian Pod on: 08/10/2020 04:06 PM   Modules accepted: Orders

## 2020-08-15 LAB — CYTOLOGY - PAP
Diagnosis: NEGATIVE
Diagnosis: REACTIVE

## 2020-09-02 ENCOUNTER — Encounter: Payer: Self-pay | Admitting: Obstetrics and Gynecology

## 2020-09-02 ENCOUNTER — Other Ambulatory Visit: Payer: Self-pay

## 2020-09-02 ENCOUNTER — Ambulatory Visit (INDEPENDENT_AMBULATORY_CARE_PROVIDER_SITE_OTHER): Payer: BC Managed Care – PPO | Admitting: Obstetrics and Gynecology

## 2020-09-02 VITALS — BP 109/74 | HR 79 | Wt 139.0 lb

## 2020-09-02 DIAGNOSIS — Z3402 Encounter for supervision of normal first pregnancy, second trimester: Secondary | ICD-10-CM

## 2020-09-02 DIAGNOSIS — H938X3 Other specified disorders of ear, bilateral: Secondary | ICD-10-CM

## 2020-09-02 DIAGNOSIS — Z1379 Encounter for other screening for genetic and chromosomal anomalies: Secondary | ICD-10-CM

## 2020-09-02 DIAGNOSIS — Z3A15 15 weeks gestation of pregnancy: Secondary | ICD-10-CM

## 2020-09-02 LAB — POCT URINALYSIS DIPSTICK OB
Bilirubin, UA: NEGATIVE
Blood, UA: NEGATIVE
Glucose, UA: NEGATIVE
Ketones, UA: NEGATIVE
Leukocytes, UA: NEGATIVE
Nitrite, UA: NEGATIVE
POC,PROTEIN,UA: NEGATIVE
Spec Grav, UA: 1.025 (ref 1.010–1.025)
Urobilinogen, UA: 0.2 E.U./dL
pH, UA: 6 (ref 5.0–8.0)

## 2020-09-02 NOTE — Progress Notes (Signed)
ROB-Pt present for routine prenatal care. Pt stated that she was doing well no problems.  

## 2020-09-02 NOTE — Progress Notes (Signed)
ROB: Doing better, notes nausea and vomiting is starting to resolve, continue home measures.  Normal MaterniT21. For AFP today. Notes some "congestion" in ears occasionally, discussed use of antihistamines. RTC in 4 weeks, for anatomy scan then.

## 2020-09-02 NOTE — Patient Instructions (Signed)
WHAT OB PATIENTS CAN EXPECT   Confirmation of pregnancy and ultrasound ordered if medically indicated-[redacted] weeks gestation  New OB (NOB) intake with nurse and New OB (NOB) labs- [redacted] weeks gestation  New OB (NOB) physical examination with provider- 11/[redacted] weeks gestation  Flu vaccine-[redacted] weeks gestation  Anatomy scan-[redacted] weeks gestation  Glucose tolerance test, blood work to test for anemia, T-dap vaccine-[redacted] weeks gestation  Vaginal swabs/cultures-STD/Group B strep-[redacted] weeks gestation  Appointments every 4 weeks until 28 weeks  Every 2 weeks from 28 weeks until 36 weeks  Weekly visits from 36 weeks until delivery  Second Trimester of Pregnancy  The second trimester is from week 14 through week 27 (month 4 through 6). This is often the time in pregnancy that you feel your best. Often times, morning sickness has lessened or quit. You may have more energy, and you may get hungry more often. Your unborn baby is growing rapidly. At the end of the sixth month, he or she is about 9 inches long and weighs about 1 pounds. You will likely feel the baby move between 18 and 20 weeks of pregnancy. Follow these instructions at home: Medicines  Take over-the-counter and prescription medicines only as told by your doctor. Some medicines are safe and some medicines are not safe during pregnancy.  Take a prenatal vitamin that contains at least 600 micrograms (mcg) of folic acid.  If you have trouble pooping (constipation), take medicine that will make your stool soft (stool softener) if your doctor approves. Eating and drinking   Eat regular, healthy meals.  Avoid raw meat and uncooked cheese.  If you get low calcium from the food you eat, talk to your doctor about taking a daily calcium supplement.  Avoid foods that are high in fat and sugars, such as fried and sweet foods.  If you feel sick to your stomach (nauseous) or throw up (vomit): ? Eat 4 or 5 small meals a day instead of 3 large  meals. ? Try eating a few soda crackers. ? Drink liquids between meals instead of during meals.  To prevent constipation: ? Eat foods that are high in fiber, like fresh fruits and vegetables, whole grains, and beans. ? Drink enough fluids to keep your pee (urine) clear or pale yellow. Activity  Exercise only as told by your doctor. Stop exercising if you start to have cramps.  Do not exercise if it is too hot, too humid, or if you are in a place of great height (high altitude).  Avoid heavy lifting.  Wear low-heeled shoes. Sit and stand up straight.  You can continue to have sex unless your doctor tells you not to. Relieving pain and discomfort  Wear a good support bra if your breasts are tender.  Take warm water baths (sitz baths) to soothe pain or discomfort caused by hemorrhoids. Use hemorrhoid cream if your doctor approves.  Rest with your legs raised if you have leg cramps or low back pain.  If you develop puffy, bulging veins (varicose veins) in your legs: ? Wear support hose or compression stockings as told by your doctor. ? Raise (elevate) your feet for 15 minutes, 3-4 times a day. ? Limit salt in your food. Prenatal care  Write down your questions. Take them to your prenatal visits.  Keep all your prenatal visits as told by your doctor. This is important. Safety  Wear your seat belt when driving.  Make a list of emergency phone numbers, including numbers for family, friends, the  hospital, and police and fire departments. General instructions  Ask your doctor about the right foods to eat or for help finding a counselor, if you need these services.  Ask your doctor about local prenatal classes. Begin classes before month 6 of your pregnancy.  Do not use hot tubs, steam rooms, or saunas.  Do not douche or use tampons or scented sanitary pads.  Do not cross your legs for long periods of time.  Visit your dentist if you have not done so. Use a soft toothbrush  to brush your teeth. Floss gently.  Avoid all smoking, herbs, and alcohol. Avoid drugs that are not approved by your doctor.  Do not use any products that contain nicotine or tobacco, such as cigarettes and e-cigarettes. If you need help quitting, ask your doctor.  Avoid cat litter boxes and soil used by cats. These carry germs that can cause birth defects in the baby and can cause a loss of your baby (miscarriage) or stillbirth. Contact a doctor if:  You have mild cramps or pressure in your lower belly.  You have pain when you pee (urinate).  You have bad smelling fluid coming from your vagina.  You continue to feel sick to your stomach (nauseous), throw up (vomit), or have watery poop (diarrhea).  You have a nagging pain in your belly area.  You feel dizzy. Get help right away if:  You have a fever.  You are leaking fluid from your vagina.  You have spotting or bleeding from your vagina.  You have severe belly cramping or pain.  You lose or gain weight rapidly.  You have trouble catching your breath and have chest pain.  You notice sudden or extreme puffiness (swelling) of your face, hands, ankles, feet, or legs.  You have not felt the baby move in over an hour.  You have severe headaches that do not go away when you take medicine.  You have trouble seeing. Summary  The second trimester is from week 14 through week 27 (months 4 through 6). This is often the time in pregnancy that you feel your best.  To take care of yourself and your unborn baby, you will need to eat healthy meals, take medicines only if your doctor tells you to do so, and do activities that are safe for you and your baby.  Call your doctor if you get sick or if you notice anything unusual about your pregnancy. Also, call your doctor if you need help with the right food to eat, or if you want to know what activities are safe for you. This information is not intended to replace advice given to you by  your health care provider. Make sure you discuss any questions you have with your health care provider. Document Revised: 02/27/2019 Document Reviewed: 12/11/2016 Elsevier Patient Education  [place]. Common Medications Safe in Pregnancy  Acne:      Constipation:  Benzoyl Peroxide     Colace  Clindamycin      Dulcolax Suppository  Topica Erythromycin     Fibercon  Salicylic Acid      Metamucil         Miralax AVOID:        Senakot   Accutane    Cough:  Retin-A       Cough Drops  Tetracycline      Phenergan w/ Codeine if Rx  Minocycline      Robitussin (Plain & DM)  Antibiotics:     Crabs/Lice:  Ceclor       RID  Cephalosporins    AVOID:  E-Mycins      Kwell  Keflex  Macrobid/Macrodantin   Diarrhea:  Penicillin      Kao-Pectate  Zithromax      Imodium AD         PUSH FLUIDS AVOID:       Cipro     Fever:  Tetracycline      Tylenol (Regular or Extra  Minocycline       Strength)  Levaquin      Extra Strength-Do not          Exceed 8 tabs/24 hrs Caffeine:        <259m/day (equiv. To 1 cup of coffee or  approx. 3 12 oz sodas)         Gas: Cold/Hayfever:       Gas-X  Benadryl      Mylicon  Claritin       Phazyme  **Claritin-D        Chlor-Trimeton    Headaches:  Dimetapp      ASA-Free Excedrin  Drixoral-Non-Drowsy     Cold Compress  Mucinex (Guaifenasin)     Tylenol (Regular or Extra  Sudafed/Sudafed-12 Hour     Strength)  **Sudafed PE Pseudoephedrine   Tylenol Cold & Sinus     Vicks Vapor Rub  Zyrtec  **AVOID if Problems With Blood Pressure         Heartburn: Avoid lying down for at least 1 hour after meals  Aciphex      Maalox     Rash:  Milk of Magnesia     Benadryl    Mylanta       1% Hydrocortisone Cream  Pepcid  Pepcid Complete   Sleep Aids:  Prevacid      Ambien   Prilosec       Benadryl  Rolaids       Chamomile Tea  Tums (Limit 4/day)     Unisom         Tylenol PM         Warm milk-add vanilla or  Hemorrhoids:       Sugar for  taste  Anusol/Anusol H.C.  (RX: Analapram 2.5%)  Sugar Substitutes:  Hydrocortisone OTC     Ok in moderation  Preparation H      Tucks        Vaseline lotion applied to tissue with wiping    Herpes:     Throat:  Acyclovir      Oragel  Famvir  Valtrex     Vaccines:         Flu Shot Leg Cramps:       *Gardasil  Benadryl      Hepatitis A         Hepatitis B Nasal Spray:       Pneumovax  Saline Nasal Spray     Polio Booster         Tetanus Nausea:       Tuberculosis test or PPD  Vitamin B6 25 mg TID   AVOID:    Dramamine      *Gardasil  Emetrol       Live Poliovirus  Ginger Root 250 mg QID    MMR (measles, mumps &  High Complex Carbs @ Bedtime    rebella)  Sea Bands-Accupressure    Varicella (Chickenpox)  Unisom 1/2 tab TID     *No known complications  If received before Pain:         Known pregnancy;   Darvocet       Resume series after  Lortab        Delivery  Percocet    Yeast:   Tramadol      Femstat  Tylenol 3      Gyne-lotrimin  Ultram       Monistat  Vicodin           MISC:         All Sunscreens           Hair Coloring/highlights          Insect Repellant's          (Including DEET)         Mystic Tans

## 2020-09-04 IMAGING — MR MR HEAD WO/W CM
15 of 19 series · 34 of 48 positions shown · IV contrast (6 ML MULTIHANCE)
Comparison: No pertinent prior studies available for comparison.

CLINICAL DATA: Hyperprolactinemia. Elevated prolactin levels,
please evaluate for pituitary adenoma; infertility, galactorrhea.

EXAM:
MRI HEAD WITHOUT AND WITH CONTRAST
TECHNIQUE: Multiplanar, multiecho pulse sequences of the brain and surrounding
structures were obtained without and with intravenous contrast.
CONTRAST:  6mL MULTIHANCE GADOBENATE DIMEGLUMINE 529 MG/ML IV SOLN

[Series 2: T1 · sagittal · 5.0mm · 0.45mm/px · 2 of 23 slices shown]
[im 1/23]
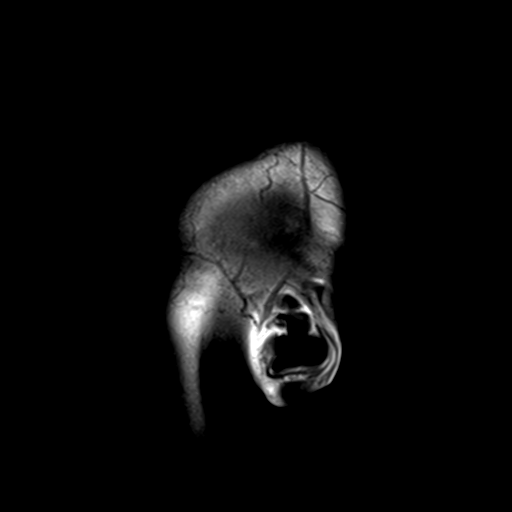
[im 23/23]
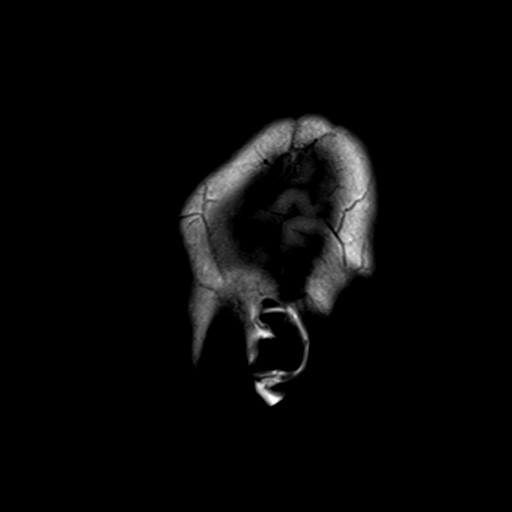

[Series 3: DWI · axial · 3.0mm · 1.80mm/px · z∈[-54,+92]mm · 8 of 100 slices shown]
[im 1/100]
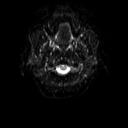
[im 12/100]
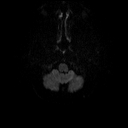
[im 34/100]
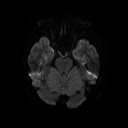
[im 45/100]
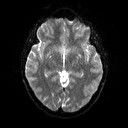
[im 56/100]
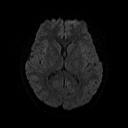
[im 67/100]
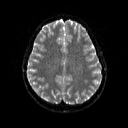
[im 89/100]
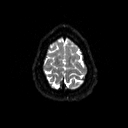
[im 100/100]
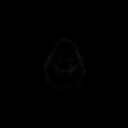

[Series 4: dwi_adc · axial · 3.0mm · 1.80mm/px · z∈[-54,+92]mm · 5 of 49 slices shown]
[im 1/49]
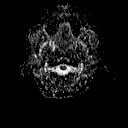
[im 13/49]
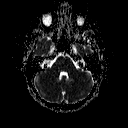
[im 25/49]
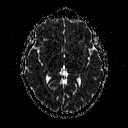
[im 37/49]
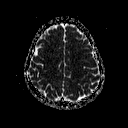
[im 49/49]
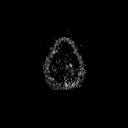

[Series 5: T2 · axial · 5.0mm · 0.36mm/px · z∈[-49,+100]mm · 2 of 24 slices shown]
[im 1/24]
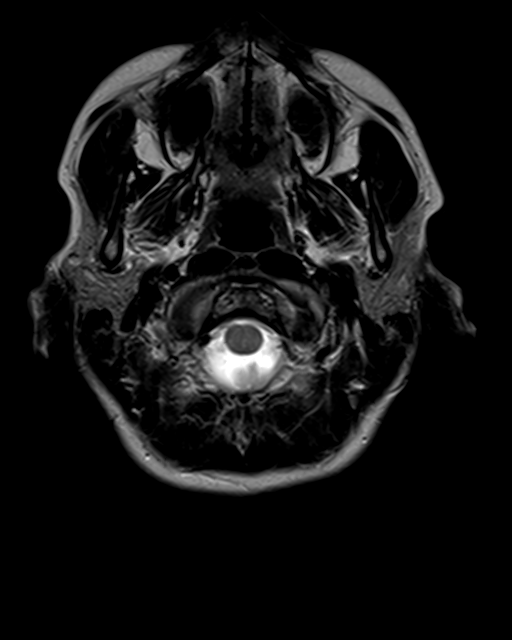
[im 24/24]
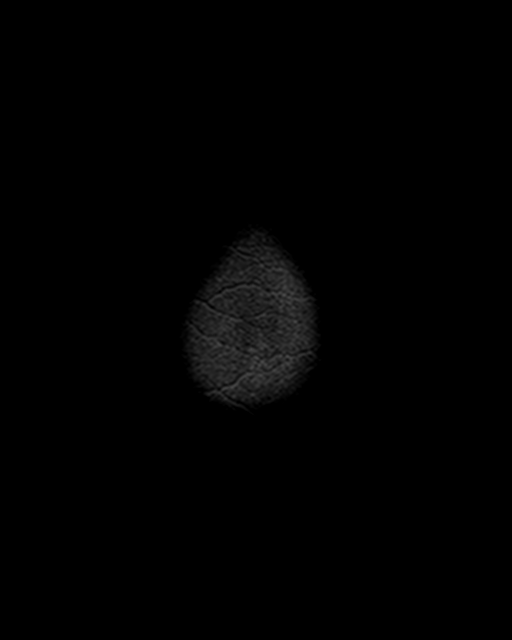

[Series 6: FLAIR · axial · 3.0mm · 0.45mm/px · z∈[-55,+98]mm · 4 of 34 slices shown]
[im 1/34]
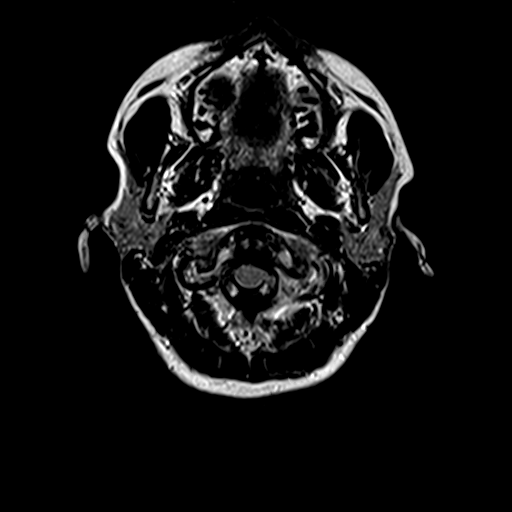
[im 12/34]
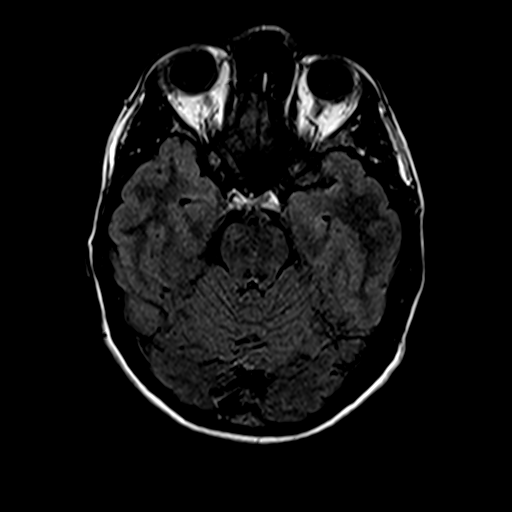
[im 23/34]
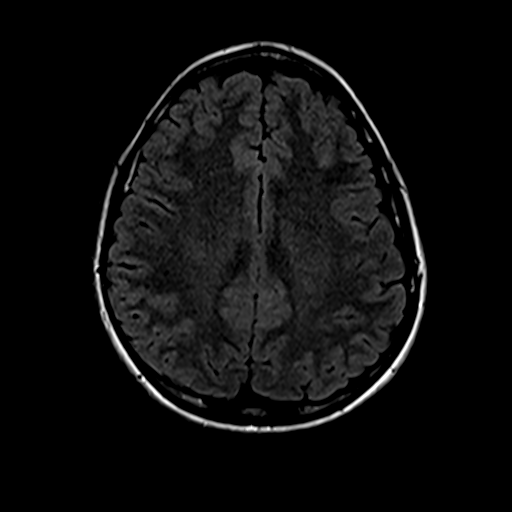
[im 34/34]
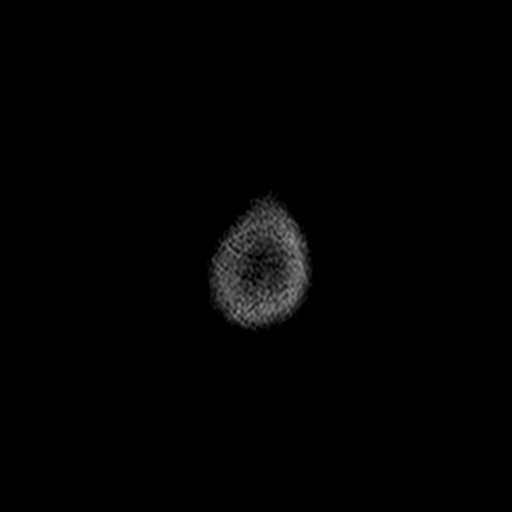

[Series 8: swi_images · axial · 4.0mm · 0.94mm/px · z∈[-64,+90]mm · 4 of 40 slices shown]
[im 1/40]
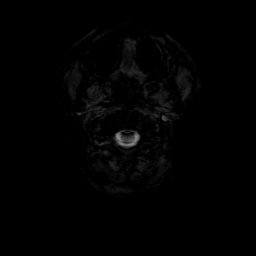
[im 14/40]
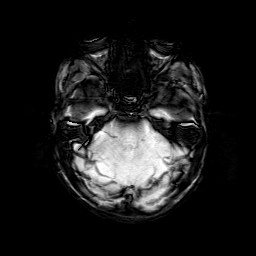
[im 27/40]
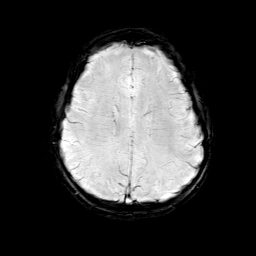
[im 40/40]
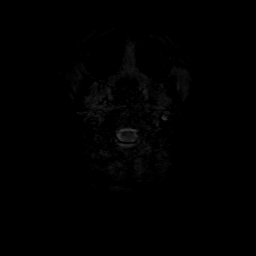

[Series 9: sag 3mm · sagittal · 3.0mm · 0.33mm/px · 1 of 11 slices shown]
[im 1/11]
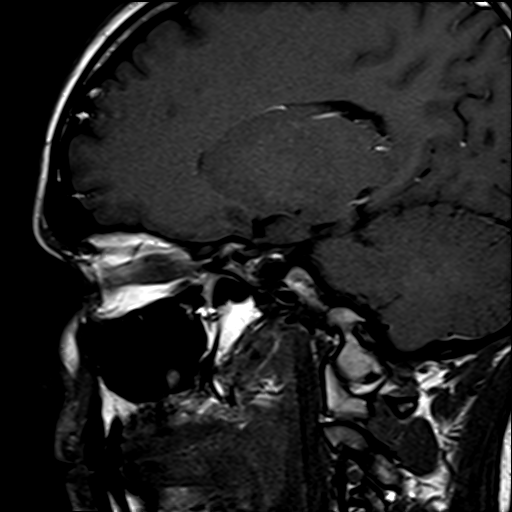

[Series 10: cor 3mm · coronal · 3.0mm · 0.33mm/px · 1 of 11 slices shown]
[im 1/11]
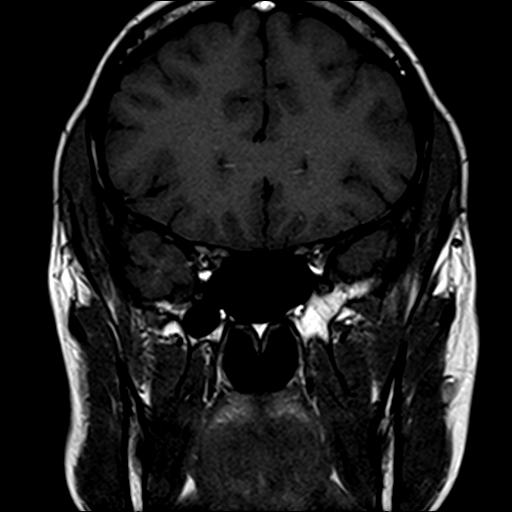

[Series 11: pre cor dynamic · coronal · non-contrast · 3.0mm · 0.35mm/px · 1 of 7 slices shown]
[im 1/7]
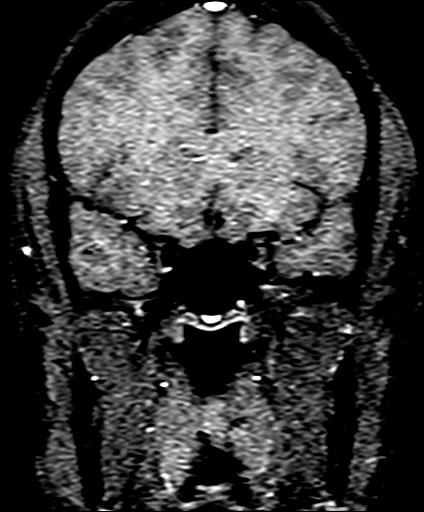

[Series 12: post fs cor · coronal · 3.0mm · 0.35mm/px · 1 of 7 slices shown (1 of 6)]
[im 1/7]
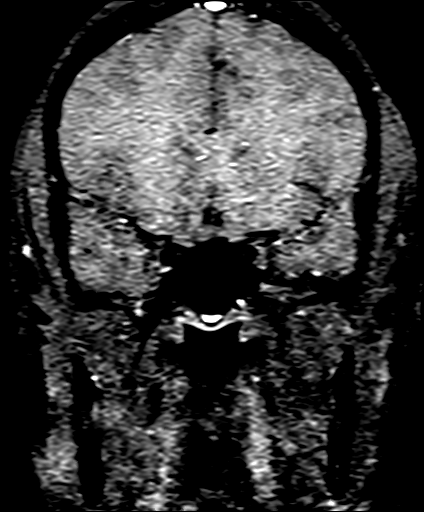

[Series 13: post fs cor · coronal · 3.0mm · 0.35mm/px · 1 of 7 slices shown (2 of 6)]
[im 1/7]
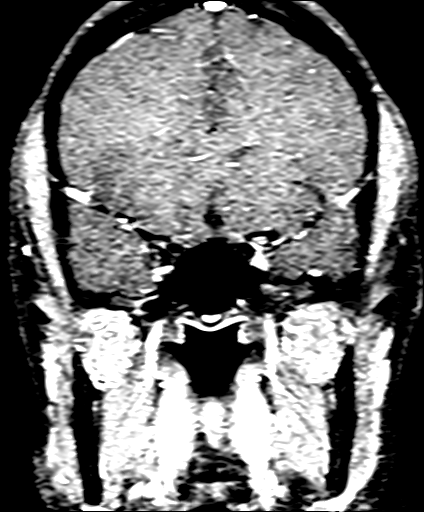

[Series 14: post fs cor · coronal · 3.0mm · 0.35mm/px · 1 of 7 slices shown (3 of 6)]
[im 1/7]
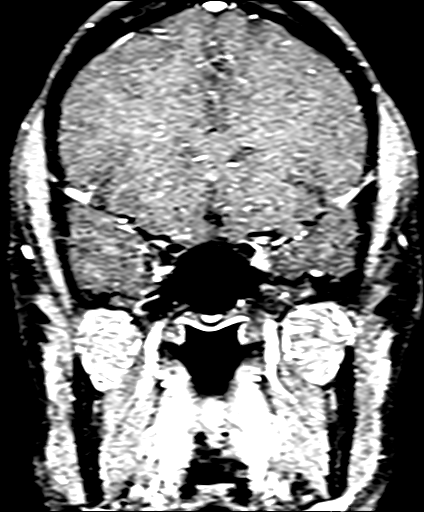

[Series 15: post fs cor · coronal · 3.0mm · 0.35mm/px · 1 of 7 slices shown (4 of 6)]
[im 1/7]
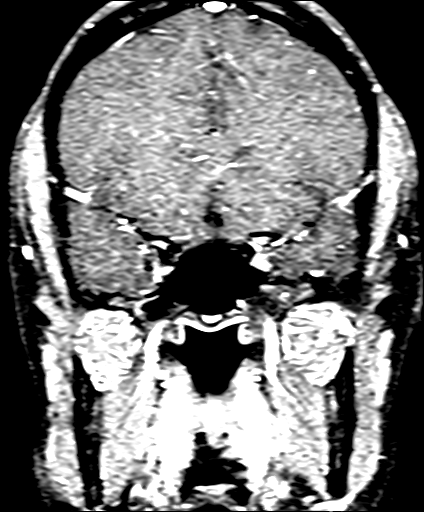

[Series 16: post fs cor · coronal · 3.0mm · 0.35mm/px · 1 of 7 slices shown (5 of 6)]
[im 1/7]
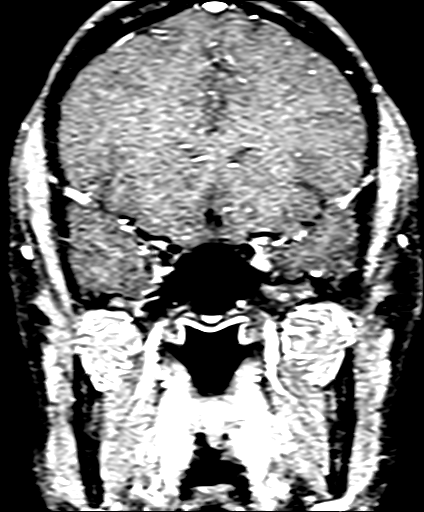

[Series 17: post fs cor · coronal · 3.0mm · 0.35mm/px · 1 of 7 slices shown (6 of 6)]
[im 1/7]
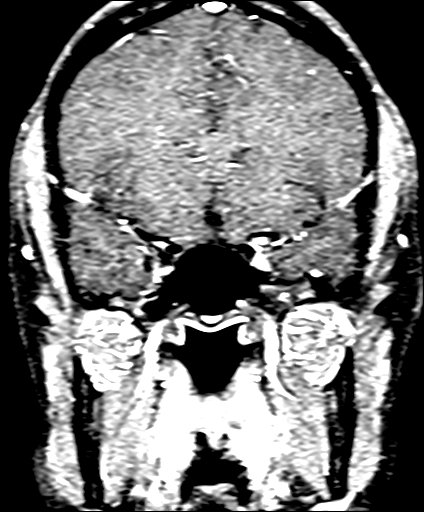

[34 of 48 positions shown; findings below may reference images not displayed]

FINDINGS: Brain:

There is intermittent motion degradation of the post-contrast
dynamic small field-of-view imaging of the pituitary gland.

There is a 3 mm focus of hypoenhancement within the left aspect of
the pituitary gland demonstrated on the dynamic postcontrast imaging
(series 16, image 4). There is subtle rightward deviation of the
pituitary stalk. The pituitary stalk is not abnormally thickened.
The suprasellar cistern is patent. No mass effect upon the optic
chiasm. An expected T1 hyperintense posterior pituitary bright spot
is present.

Cerebral volume is normal.

There is no acute infarct.

No chronic intracranial blood products.

No extra-axial fluid collection.

No midline shift.

No abnormal intracranial enhancement is demonstrated elsewhere.

Vascular: Expected proximal arterial flow voids.

Skull and upper cervical spine: No focal marrow lesion.

Sinuses/Orbits: Visualized orbits show no acute finding. Mild
ethmoid sinus mucosal thickening. Tiny left maxillary sinus mucous
retention cysts. No significant mastoid effusion.
IMPRESSION: 3 mm hypoenhancing focus within the left aspect of the pituitary
gland. Although not entirely specific, this could reflect a small
microadenoma given the provided history of hyperprolactinemia.

Otherwise unremarkable MRI appearance of the brain.

Mild ethmoid sinus mucosal thickening. Tiny left maxillary sinus
mucous retention cysts.

## 2020-09-06 LAB — AFP, SERUM, OPEN SPINA BIFIDA
AFP MoM: 0.86
AFP Value: 28.4 ng/mL
Gest. Age on Collection Date: 15.5 weeks
Maternal Age At EDD: 28.1 yr
OSBR Risk 1 IN: 10000
Test Results:: NEGATIVE
Weight: 139 [lb_av]

## 2020-09-29 ENCOUNTER — Encounter: Payer: BC Managed Care – PPO | Admitting: Obstetrics and Gynecology

## 2020-09-29 ENCOUNTER — Ambulatory Visit (INDEPENDENT_AMBULATORY_CARE_PROVIDER_SITE_OTHER): Payer: BC Managed Care – PPO

## 2020-09-29 ENCOUNTER — Ambulatory Visit (INDEPENDENT_AMBULATORY_CARE_PROVIDER_SITE_OTHER): Payer: BC Managed Care – PPO | Admitting: Obstetrics and Gynecology

## 2020-09-29 ENCOUNTER — Other Ambulatory Visit: Payer: Self-pay

## 2020-09-29 ENCOUNTER — Other Ambulatory Visit: Payer: BC Managed Care – PPO

## 2020-09-29 ENCOUNTER — Encounter: Payer: Self-pay | Admitting: Obstetrics and Gynecology

## 2020-09-29 VITALS — BP 116/76 | HR 78 | Wt 146.3 lb

## 2020-09-29 DIAGNOSIS — Z3402 Encounter for supervision of normal first pregnancy, second trimester: Secondary | ICD-10-CM

## 2020-09-29 DIAGNOSIS — Z3A19 19 weeks gestation of pregnancy: Secondary | ICD-10-CM

## 2020-09-29 NOTE — Progress Notes (Signed)
ROB: No complaints.  Patient had FAS today. (Incomplete and EIF).  Will repeat for complete and check EIF.  Patient now feeling daily fetal movement.

## 2020-10-25 ENCOUNTER — Other Ambulatory Visit: Payer: Self-pay | Admitting: Obstetrics and Gynecology

## 2020-10-25 DIAGNOSIS — Z09 Encounter for follow-up examination after completed treatment for conditions other than malignant neoplasm: Secondary | ICD-10-CM

## 2020-10-26 ENCOUNTER — Other Ambulatory Visit: Payer: BC Managed Care – PPO

## 2020-10-26 ENCOUNTER — Encounter: Payer: BC Managed Care – PPO | Admitting: Obstetrics and Gynecology

## 2020-10-27 ENCOUNTER — Other Ambulatory Visit: Payer: BC Managed Care – PPO

## 2020-10-27 ENCOUNTER — Encounter: Payer: BC Managed Care – PPO | Admitting: Obstetrics and Gynecology

## 2020-11-01 ENCOUNTER — Other Ambulatory Visit: Payer: Self-pay

## 2020-11-01 ENCOUNTER — Ambulatory Visit (INDEPENDENT_AMBULATORY_CARE_PROVIDER_SITE_OTHER): Payer: BC Managed Care – PPO

## 2020-11-01 ENCOUNTER — Ambulatory Visit (INDEPENDENT_AMBULATORY_CARE_PROVIDER_SITE_OTHER): Payer: BC Managed Care – PPO | Admitting: Obstetrics and Gynecology

## 2020-11-01 ENCOUNTER — Encounter: Payer: Self-pay | Admitting: Obstetrics and Gynecology

## 2020-11-01 VITALS — BP 110/71 | HR 79 | Wt 152.0 lb

## 2020-11-01 DIAGNOSIS — Z3A24 24 weeks gestation of pregnancy: Secondary | ICD-10-CM | POA: Diagnosis not present

## 2020-11-01 DIAGNOSIS — Z09 Encounter for follow-up examination after completed treatment for conditions other than malignant neoplasm: Secondary | ICD-10-CM

## 2020-11-01 DIAGNOSIS — Z3402 Encounter for supervision of normal first pregnancy, second trimester: Secondary | ICD-10-CM

## 2020-11-01 NOTE — Progress Notes (Signed)
ROB: Doing well, no complaints. S/p f/u anatomy scan for spine views, completed today. Desires to breastfeed. Patient desires to discuss alternative options to completing 1 hr glucola (notes that she read online about the drink and is concerned about food additives and coloring).  Discussed alternative options to glucola drink, given handout.  RTC in 4 weeks.   The following were addressed during this visit:  Breastfeeding Education - Early initiation of breastfeeding    Comments: Keeps milk supply adequate, helps contract uterus and slow bleeding, and early milk is the perfect first food and is easy to digest.   - The importance of exclusive breastfeeding    Comments: Provides antibodies, Lower risk of breast and ovarian cancers, and type-2 diabetes,Helps your body recover, Reduced chance of SIDS.   - The importance of early skin-to-skin contact    Comments: Keeps baby warm and secure, helps keep baby's blood sugar up and breathing steady, easier to bond and breastfeed, and helps calm baby.  - Exclusive breastfeeding for the first 6 months    Comments: Builds a healthy milk supply and keeps it up, protects baby from sickness and disease, and breastmilk has everything your baby needs for the first 6 months.   Hildred Laser, MD [facility]

## 2020-11-01 NOTE — Progress Notes (Signed)
ROB-Routine prenatal care- pt stated that she was doing well no problems.

## 2020-11-01 NOTE — Patient Instructions (Signed)
WHAT OB PATIENTS CAN EXPECT   Confirmation of pregnancy and ultrasound ordered if medically indicated-[redacted] weeks gestation  New OB (NOB) intake with nurse and New OB (NOB) labs- [redacted] weeks gestation  New OB (NOB) physical examination with provider- 11/[redacted] weeks gestation  Flu vaccine-[redacted] weeks gestation  Anatomy scan-[redacted] weeks gestation  Glucose tolerance test, blood work to test for anemia, T-dap vaccine-[redacted] weeks gestation  Vaginal swabs/cultures-STD/Group B strep-[redacted] weeks gestation  Appointments every 4 weeks until 28 weeks  Every 2 weeks from 28 weeks until 36 weeks  Weekly visits from 36 weeks until delivery   Second Trimester of Pregnancy  The second trimester is from week 14 through week 27 (month 4 through 6). This is often the time in pregnancy that you feel your best. Often times, morning sickness has lessened or quit. You may have more energy, and you may get hungry more often. Your unborn baby is growing rapidly. At the end of the sixth month, he or she is about 9 inches long and weighs about 1 pounds. You will likely feel the baby move between 18 and 20 weeks of pregnancy. Follow these instructions at home: Medicines  Take over-the-counter and prescription medicines only as told by your doctor. Some medicines are safe and some medicines are not safe during pregnancy.  Take a prenatal vitamin that contains at least 600 micrograms (mcg) of folic acid.  If you have trouble pooping (constipation), take medicine that will make your stool soft (stool softener) if your doctor approves. Eating and drinking   Eat regular, healthy meals.  Avoid raw meat and uncooked cheese.  If you get low calcium from the food you eat, talk to your doctor about taking a daily calcium supplement.  Avoid foods that are high in fat and sugars, such as fried and sweet foods.  If you feel sick to your stomach (nauseous) or throw up (vomit): ? Eat 4 or 5 small meals a day instead of 3 large  meals. ? Try eating a few soda crackers. ? Drink liquids between meals instead of during meals.  To prevent constipation: ? Eat foods that are high in fiber, like fresh fruits and vegetables, whole grains, and beans. ? Drink enough fluids to keep your pee (urine) clear or pale yellow. Activity  Exercise only as told by your doctor. Stop exercising if you start to have cramps.  Do not exercise if it is too hot, too humid, or if you are in a place of great height (high altitude).  Avoid heavy lifting.  Wear low-heeled shoes. Sit and stand up straight.  You can continue to have sex unless your doctor tells you not to. Relieving pain and discomfort  Wear a good support bra if your breasts are tender.  Take warm water baths (sitz baths) to soothe pain or discomfort caused by hemorrhoids. Use hemorrhoid cream if your doctor approves.  Rest with your legs raised if you have leg cramps or low back pain.  If you develop puffy, bulging veins (varicose veins) in your legs: ? Wear support hose or compression stockings as told by your doctor. ? Raise (elevate) your feet for 15 minutes, 3-4 times a day. ? Limit salt in your food. Prenatal care  Write down your questions. Take them to your prenatal visits.  Keep all your prenatal visits as told by your doctor. This is important. Safety  Wear your seat belt when driving.  Make a list of emergency phone numbers, including numbers for family, friends,  hospital, and police and fire departments. General instructions  Ask your doctor about the right foods to eat or for help finding a counselor, if you need these services.  Ask your doctor about local prenatal classes. Begin classes before month 6 of your pregnancy.  Do not use hot tubs, steam rooms, or saunas.  Do not douche or use tampons or scented sanitary pads.  Do not cross your legs for long periods of time.  Visit your dentist if you have not done so. Use a soft toothbrush  to brush your teeth. Floss gently.  Avoid all smoking, herbs, and alcohol. Avoid drugs that are not approved by your doctor.  Do not use any products that contain nicotine or tobacco, such as cigarettes and e-cigarettes. If you need help quitting, ask your doctor.  Avoid cat litter boxes and soil used by cats. These carry germs that can cause birth defects in the baby and can cause a loss of your baby (miscarriage) or stillbirth. Contact a doctor if:  You have mild cramps or pressure in your lower belly.  You have pain when you pee (urinate).  You have bad smelling fluid coming from your vagina.  You continue to feel sick to your stomach (nauseous), throw up (vomit), or have watery poop (diarrhea).  You have a nagging pain in your belly area.  You feel dizzy. Get help right away if:  You have a fever.  You are leaking fluid from your vagina.  You have spotting or bleeding from your vagina.  You have severe belly cramping or pain.  You lose or gain weight rapidly.  You have trouble catching your breath and have chest pain.  You notice sudden or extreme puffiness (swelling) of your face, hands, ankles, feet, or legs.  You have not felt the baby move in over an hour.  You have severe headaches that do not go away when you take medicine.  You have trouble seeing. Summary  The second trimester is from week 14 through week 27 (months 4 through 6). This is often the time in pregnancy that you feel your best.  To take care of yourself and your unborn baby, you will need to eat healthy meals, take medicines only if your doctor tells you to do so, and do activities that are safe for you and your baby.  Call your doctor if you get sick or if you notice anything unusual about your pregnancy. Also, call your doctor if you need help with the right food to eat, or if you want to know what activities are safe for you. This information is not intended to replace advice given to you by  your health care provider. Make sure you discuss any questions you have with your health care provider. Document Revised: 02/27/2019 Document Reviewed: 12/11/2016 Elsevier Patient Education  2020 [employer] Common Medications Safe in Pregnancy  Acne:      Constipation:  Benzoyl Peroxide     Colace  Clindamycin      Dulcolax Suppository  Topica Erythromycin     Fibercon  Salicylic Acid      Metamucil         Miralax AVOID:        Senakot   Accutane    Cough:  Retin-A       Cough Drops  Tetracycline      Phenergan w/ Codeine if Rx  Minocycline      Robitussin (Plain & DM)  Antibiotics:     Crabs/Lice:    Ceclor       RID  Cephalosporins    AVOID:  E-Mycins      Kwell  Keflex  Macrobid/Macrodantin   Diarrhea:  Penicillin      Kao-Pectate  Zithromax      Imodium AD         PUSH FLUIDS AVOID:       Cipro     Fever:  Tetracycline      Tylenol (Regular or Extra  Minocycline       Strength)  Levaquin      Extra Strength-Do not          Exceed 8 tabs/24 hrs Caffeine:        <200mg/day (equiv. To 1 cup of coffee or  approx. 3 12 oz sodas)         Gas: Cold/Hayfever:       Gas-X  Benadryl      Mylicon  Claritin       Phazyme  **Claritin-D        Chlor-Trimeton    Headaches:  Dimetapp      ASA-Free Excedrin  Drixoral-Non-Drowsy     Cold Compress  Mucinex (Guaifenasin)     Tylenol (Regular or Extra  Sudafed/Sudafed-12 Hour     Strength)  **Sudafed PE Pseudoephedrine   Tylenol Cold & Sinus     Vicks Vapor Rub  Zyrtec  **AVOID if Problems With Blood Pressure         Heartburn: Avoid lying down for at least 1 hour after meals  Aciphex      Maalox     Rash:  Milk of Magnesia     Benadryl    Mylanta       1% Hydrocortisone Cream  Pepcid  Pepcid Complete   Sleep Aids:  Prevacid      Ambien   Prilosec       Benadryl  Rolaids       Chamomile Tea  Tums (Limit 4/day)     Unisom         Tylenol PM         Warm milk-add vanilla or  Hemorrhoids:       Sugar for  taste  Anusol/Anusol H.C.  (RX: Analapram 2.5%)  Sugar Substitutes:  Hydrocortisone OTC     Ok in moderation  Preparation H      Tucks        Vaseline lotion applied to tissue with wiping    Herpes:     Throat:  Acyclovir      Oragel  Famvir  Valtrex     Vaccines:         Flu Shot Leg Cramps:       *Gardasil  Benadryl      Hepatitis A         Hepatitis B Nasal Spray:       Pneumovax  Saline Nasal Spray     Polio Booster         Tetanus Nausea:       Tuberculosis test or PPD  Vitamin B6 25 mg TID   AVOID:    Dramamine      *Gardasil  Emetrol       Live Poliovirus  Ginger Root 250 mg QID    MMR (measles, mumps &  High Complex Carbs @ Bedtime    rebella)  Sea Bands-Accupressure    Varicella (Chickenpox)  Unisom 1/2 tab TID     *No known complications             If received before Pain:         Known pregnancy;   Darvocet       Resume series after  Lortab        Delivery  Percocet    Yeast:   Tramadol      Femstat  Tylenol 3      Gyne-lotrimin  Ultram       Monistat  Vicodin           MISC:         All Sunscreens           Hair Coloring/highlights          Insect Repellant's          (Including DEET)         Mystic Tans

## 2020-11-15 ENCOUNTER — Encounter: Payer: BC Managed Care – PPO | Admitting: Obstetrics and Gynecology

## 2020-11-19 NOTE — L&D Delivery Note (Signed)
Delivery Summary for Vanessa Cordova  Labor Events:   Preterm labor: No data found  Rupture date: 02/24/2021  Rupture time: 7:31 AM  Rupture type: Artificial  Fluid Color: Clear  Induction: No data found  Augmentation: No data found  Complications: No data found  Cervical ripening: No data found No data found   No data found     Delivery:   Episiotomy: No data found  Lacerations: No data found  Repair suture: No data found  Repair # of packets: No data found  Blood loss (ml): 650   Information for the patient's newborn:  Casie, Sturgeon [[id number]]   Delivery 02/24/2021 7:26 PM by  Vaginal, Spontaneous Sex:  female Gestational Age: [redacted]w[redacted]d Delivery Clinician:   Living?:         APGARS  One minute Five minutes Ten minutes  Skin color:        Heart rate:        Grimace:        Muscle tone:        Breathing:        Totals: 8  9      Presentation/position:      Resuscitation:   Cord information:    Disposition of cord blood:     Blood gases sent?  Complications:   Placenta: Delivered:       appearance Newborn Measurements: Weight: 7 lb 11.5 oz (3500 g)  Height: 20"  Head circumference:    Chest circumference:    Other providers:    Additional  information: Forceps:   Vacuum:   Breech:   Observed anomalies       Delivery Note At 7:26 PM a viable and healthy female "[name]" was delivered via Vaginal, Spontaneous (Presentation: Right Occiput Anterior).  APGAR: 8, 9; weight  3500 g.   Placenta status: Spontaneous, Intact.  Cord: 3 vessels with the following complications: None.  Delayed cord clamping observed. Cord pH: not obtained.    Anesthesia: Epidural Episiotomy: None Lacerations: Perineal ;2nd degree Suture Repair: 3.0 vicryl rapide.   Est. Blood Loss (mL): 650.  800 mcg of Cytotec administered rectally.   Mom to postpartum.  Baby to Couplet care / Skin to Skin.  Hildred Laser, MD 02/24/2021, 8:22 PM

## 2020-12-01 ENCOUNTER — Other Ambulatory Visit: Payer: Self-pay

## 2020-12-01 ENCOUNTER — Encounter: Payer: Self-pay | Admitting: Obstetrics and Gynecology

## 2020-12-01 ENCOUNTER — Ambulatory Visit (INDEPENDENT_AMBULATORY_CARE_PROVIDER_SITE_OTHER): Payer: BC Managed Care – PPO | Admitting: Obstetrics and Gynecology

## 2020-12-01 VITALS — BP 115/77 | HR 75 | Wt 156.6 lb

## 2020-12-01 DIAGNOSIS — Z23 Encounter for immunization: Secondary | ICD-10-CM

## 2020-12-01 DIAGNOSIS — Z3402 Encounter for supervision of normal first pregnancy, second trimester: Secondary | ICD-10-CM

## 2020-12-01 DIAGNOSIS — Z3A28 28 weeks gestation of pregnancy: Secondary | ICD-10-CM

## 2020-12-01 LAB — POCT URINALYSIS DIPSTICK OB
Bilirubin, UA: NEGATIVE
Blood, UA: NEGATIVE
Glucose, UA: NEGATIVE
Ketones, UA: NEGATIVE
Leukocytes, UA: NEGATIVE
Nitrite, UA: NEGATIVE
POC,PROTEIN,UA: NEGATIVE
Spec Grav, UA: 1.01 (ref 1.010–1.025)
Urobilinogen, UA: 0.2 E.U./dL
pH, UA: 6.5 (ref 5.0–8.0)

## 2020-12-01 NOTE — Addendum Note (Signed)
Addended by: Dorian Pod on: 12/01/2020 10:10 AM   Modules accepted: Orders

## 2020-12-01 NOTE — Progress Notes (Signed)
ROB: No complaints.  1 hour GCT today.  Reports active fetal movement daily.  Declines COVID vaccination.

## 2020-12-02 LAB — CBC
Hematocrit: 38.7 % (ref 34.0–46.6)
Hemoglobin: 13.2 g/dL (ref 11.1–15.9)
MCH: 32.2 pg (ref 26.6–33.0)
MCHC: 34.1 g/dL (ref 31.5–35.7)
MCV: 94 fL (ref 79–97)
Platelets: 213 10*3/uL (ref 150–450)
RBC: 4.1 x10E6/uL (ref 3.77–5.28)
RDW: 11.8 % (ref 11.7–15.4)
WBC: 10.8 10*3/uL (ref 3.4–10.8)

## 2020-12-02 LAB — GLUCOSE, 1 HOUR GESTATIONAL: Gestational Diabetes Screen: 76 mg/dL (ref 65–139)

## 2020-12-02 LAB — RPR: RPR Ser Ql: NONREACTIVE

## 2020-12-22 ENCOUNTER — Other Ambulatory Visit: Payer: Self-pay

## 2020-12-22 ENCOUNTER — Ambulatory Visit (INDEPENDENT_AMBULATORY_CARE_PROVIDER_SITE_OTHER): Payer: BC Managed Care – PPO | Admitting: Obstetrics and Gynecology

## 2020-12-22 ENCOUNTER — Encounter: Payer: Self-pay | Admitting: Obstetrics and Gynecology

## 2020-12-22 VITALS — BP 114/77 | HR 80 | Ht 63.0 in | Wt 160.5 lb

## 2020-12-22 DIAGNOSIS — Z3A31 31 weeks gestation of pregnancy: Secondary | ICD-10-CM

## 2020-12-22 DIAGNOSIS — Z3403 Encounter for supervision of normal first pregnancy, third trimester: Secondary | ICD-10-CM

## 2020-12-22 LAB — POCT URINALYSIS DIPSTICK OB
Bilirubin, UA: NEGATIVE
Blood, UA: NEGATIVE
Glucose, UA: NEGATIVE
Ketones, UA: NEGATIVE
Nitrite, UA: NEGATIVE
POC,PROTEIN,UA: NEGATIVE
Spec Grav, UA: <=1.005 — AB (ref 1.010–1.025)
Urobilinogen, UA: 0.2 E.U./dL
pH, UA: 7 (ref 5.0–8.0)

## 2020-12-22 NOTE — Progress Notes (Signed)
ROB-Pt present for routine prenatal care. Pt stated that she was doing well no problems.  

## 2020-12-22 NOTE — Patient Instructions (Addendum)
Breastfeeding Tips for a Good Latch Breastfeeding can be challenging, especially during the first few weeks after childbirth. It is normal to have some problems when you start to breastfeed your new baby, even if you have breastfed before. Latching is an important part of having a good breastfeeding experience. This refers to how your baby's mouth attaches to your nipple to breastfeed. Your baby may have trouble latching due to:  Poor positioning.  Nipple confusion. This can occur if you introduce a bottle or pacifier too early.  Abnormalities in your baby's mouth, tongue, or lips. This includes conditions like tongue-tie or cleft lip.  The shape of your nipples, such as nipples that are flat or turned in (inverted).  Your baby being born early (prematurely). Small babies often have a weak suck.  Breasts becoming overfilled with milk (engorged breasts). Breasts that are too full can make a latch difficult. If breasts are engorged, express a little milk to help soften the breast. Work with a breastfeeding specialist (Advertising copywriter) to find positions and strategies that will help make sure your baby has a good latch. How does this affect me? A poor latch may cause you to have problems such as:  Cracked or sore nipples.  Engorged breasts.  Plugged milk ducts.  Low milk supply.  Breast inflammation or infection. How does this affect my baby? A poor latch may cause your baby to not be able to feed effectively. As a result, he or she may have trouble gaining weight. Follow these instructions at home: How to position your baby  Find a comfortable place to sit or lie down, with your neck and back well supported.  If you are seated, place a pillow or rolled-up blanket under your baby to bring him or her to the level of your breast.  Make sure that your baby's belly is facing your abdomen.  Try different positions to find one that works best for you and your baby. How to help  your baby latch To begin each breastfeeding session, you may find it helpful to gently massage your breast. With your fingertips, massage from your chest wall toward your nipple in a circular motion. This encourages milk flow. If your milk flows slowly, you may need to continue this action during feeding. Position your breast for an ideal latch. Support your breast with four fingers underneath and your thumb above your nipple. Keep your fingers away from your nipple and your baby's mouth. To help your baby latch, follow these steps: 1. Stroke your baby's lips gently with your finger or nipple. 2. When your baby's mouth is open wide enough, quickly bring your baby to your breast and place your entire nipple, and as much of the areolaas possible, into your baby's mouth. The areola is the colored area around your nipple. 3. Your baby's tongue should be between his or her lower gum and your breast. 4. More areola should be visible above your baby's upper lip than below the lower lip. 5. When your baby starts sucking, you will feel a gentle pull on your nipple, but you should not feel pain. Be patient. It is common for a baby to suck for about 2-3 minutes to start the flow of breast milk. 6. Make sure that your baby's mouth is correctly positioned around your nipple. Your baby's lips should create a seal on your breast and be turned outward (everted).   General instructions  Look for the following signs that your baby has successfully latched  on to your nipple: ? The baby is quietly tugging or quietly sucking without causing you pain. ? You hear the baby swallow after every 3 or 4 sucks. ? You see muscle movement above and in front of the baby's ears while he or she is sucking.  Be aware of these signs that your baby has not successfully latched on to your nipple: ? The baby makes sucking sounds or smacking sounds while nursing. ? You have nipple pain.  If your baby is not latched well, insert your  little finger between your baby's gums and your nipple to break the seal. Then try to help your baby latch again. Contact a health care provider if:  You have cracking or soreness in your nipples that lasts longer than 1 week.  You have nipple pain. Nipple cracking and soreness are common during the first week after birth, but nipple pain is never normal.  You have breast engorgement that does not improve after 48-72 hours.  You have a plugged milk duct and a fever.  You follow suggestions for a good latch but you continue to have problems or concerns.  You have pus-like discharge coming from your breast.  Your baby is not gaining weight or loses weight. Summary  Many common breastfeeding challenges are caused by poor latching.  Latching refers to how your baby's mouth attaches to your nipple during breastfeeding.  If problems continue, seek help from a lactation consultant in your community. He or she can assess you and your baby for any latching problems.  The lactation consultant can work with you to develop a plan to overcome any breastfeeding challenges. This information is not intended to replace advice given to you by your health care provider. Make sure you discuss any questions you have with your health care provider. Document Revised: 05/04/2020 Document Reviewed: 05/04/2020 Elsevier Patient Education  2021 Elsevier Inc.    [email or web address]">  Third Trimester of Pregnancy  The third trimester of pregnancy is from week 28 through week 40. This is months 7 through 9. The third trimester is a time when the unborn baby (fetus) is growing rapidly. At the end of the ninth month, the fetus is about 20 inches long and weighs 6-10 pounds. Body changes during your third trimester During the third trimester, your body will continue to go through many changes. The changes vary and generally return to normal after your baby is  born. Physical changes  Your weight will continue to increase. You can expect to gain 25-35 pounds (11-16 kg) by the end of the pregnancy if you begin pregnancy at a normal weight. If you are underweight, you can expect to gain 28-40 lb (about 13-18 kg), and if you are overweight, you can expect to gain 15-25 lb (about 7-11 kg).  You may begin to get stretch marks on your hips, abdomen, and breasts.  Your breasts will continue to grow and may hurt. A yellow fluid (colostrum) may leak from your breasts. This is the first milk you are producing for your baby.  You may have changes in your hair. These can include thickening of your hair, rapid growth, and changes in texture. Some people also have hair loss during or after pregnancy, or hair that feels dry or thin.  Your belly button may stick out.  You may notice more swelling in your hands, face, or ankles. Health changes  You may have heartburn.  You may have constipation.  You may develop hemorrhoids.  You may develop  swollen, bulging veins (varicose veins) in your legs.  You may have increased body aches in the pelvis, back, or thighs. This is due to weight gain and increased hormones that are relaxing your joints.  You may have increased tingling or numbness in your hands, arms, and legs. The skin on your abdomen may also feel numb.  You may feel short of breath because of your expanding uterus. Other changes  You may urinate more often because the fetus is moving lower into your pelvis and pressing on your bladder.  You may have more problems sleeping. This may be caused by the size of your abdomen, an increased need to urinate, and an increase in your body's metabolism.  You may notice the fetus "dropping," or moving lower in your abdomen (lightening).  You may have increased vaginal discharge.  You may notice that you have pain around your pelvic bone as your uterus distends. Follow these instructions at  home: Medicines  Follow your health care provider's instructions regarding medicine use. Specific medicines may be either safe or unsafe to take during pregnancy. Do not take any medicines unless approved by your health care provider.  Take a prenatal vitamin that contains at least 600 micrograms (mcg) of folic acid. Eating and drinking  Eat a healthy diet that includes fresh fruits and vegetables, whole grains, good sources of protein such as meat, eggs, or tofu, and low-fat dairy products.  Avoid raw meat and unpasteurized juice, milk, and cheese. These carry germs that can harm you and your baby.  Eat 4 or 5 small meals rather than 3 large meals a day.  You may need to take these actions to prevent or treat constipation: ? Drink enough fluid to keep your urine pale yellow. ? Eat foods that are high in fiber, such as beans, whole grains, and fresh fruits and vegetables. ? Limit foods that are high in fat and processed sugars, such as fried or sweet foods. Activity  Exercise only as directed by your health care provider. Most people can continue their usual exercise routine during pregnancy. Try to exercise for 30 minutes at least 5 days a week. Stop exercising if you experience contractions in the uterus.  Stop exercising if you develop pain or cramping in the lower abdomen or lower back.  Avoid heavy lifting.  Do not exercise if it is very hot or humid or if you are at a high altitude.  If you choose to, you may continue to have sex unless your health care provider tells you not to. Relieving pain and discomfort  Take frequent breaks and rest with your legs raised (elevated) if you have leg cramps or low back pain.  Take warm sitz baths to soothe any pain or discomfort caused by hemorrhoids. Use hemorrhoid cream if your health care provider approves.  Wear a supportive bra to prevent discomfort from breast tenderness.  If you develop varicose veins: ? Wear support hose as  told by your health care provider. ? Elevate your feet for 15 minutes, 3-4 times a day. ? Limit salt in your diet. Safety  Talk to your health care provider before traveling far distances.  Do not use hot tubs, steam rooms, or saunas.  Wear your seat belt at all times when driving or riding in a car.  Talk with your health care provider if someone is verbally or physically abusive to you. Preparing for birth To prepare for the arrival of your baby:  Take prenatal classes to understand,  practice, and ask questions about labor and delivery.  Visit the hospital and tour the maternity area.  Purchase a rear-facing car seat and make sure you know how to install it in your car.  Prepare the baby's room or sleeping area. Make sure to remove all pillows and stuffed animals from the baby's crib to prevent suffocation. General instructions  Avoid cat litter boxes and soil used by cats. These carry germs that can cause birth defects in the baby. If you have a cat, ask someone to clean the litter box for you.  Do not douche or use tampons. Do not use scented sanitary pads.  Do not use any products that contain nicotine or tobacco, such as cigarettes, e-cigarettes, and chewing tobacco. If you need help quitting, ask your health care provider.  Do not use any herbal remedies, illegal drugs, or medicines that were not prescribed to you. Chemicals in these products can harm your baby.  Do not drink alcohol.  You will have more frequent prenatal exams during the third trimester. During a routine prenatal visit, your health care provider will do a physical exam, perform tests, and discuss your overall health. Keep all follow-up visits. This is important. Where to find more information  American Pregnancy Association: americanpregnancy.org  [employer]: https://www.todd-brady.net/  Office on [employer] Health: [employer] Contact a  health care provider if you have:  A fever.  Mild pelvic cramps, pelvic pressure, or nagging pain in your abdominal area or lower back.  Vomiting or diarrhea.  Bad-smelling vaginal discharge or foul-smelling urine.  Pain when you urinate.  A headache that does not go away when you take medicine.  Visual changes or see spots in front of your eyes. Get help right away if:  Your water breaks.  You have regular contractions less than 5 minutes apart.  You have spotting or bleeding from your vagina.  You have severe abdominal pain.  You have difficulty breathing.  You have chest pain.  You have fainting spells.  You have not felt your baby move for the time period told by your health care provider.  You have new or increased pain, swelling, or redness in an arm or leg. Summary  The third trimester of pregnancy is from week 28 through week 40 (months 7 through 9).  You may have more problems sleeping. This can be caused by the size of your abdomen, an increased need to urinate, and an increase in your body's metabolism.  You will have more frequent prenatal exams during the third trimester. Keep all follow-up visits. This is important. This information is not intended to replace advice given to you by your health care provider. Make sure you discuss any questions you have with your health care provider. Document Revised: 04/13/2020 Document Reviewed: 02/18/2020 Elsevier Patient Education  2021 [employer].

## 2020-12-22 NOTE — Progress Notes (Signed)
ROB: Doing well, no complaints. Normal 28 week labs. Discussed breastfeeding. Discussed pain management in labor, thinks she will opt for epidural.  Patient asks about birth plan, given handouts. Also advised on childbirth/breastfeeding classes online. RTC in 2 weeks.     The following were addressed during this visit:  Breastfeeding Education - Risks of giving your baby anything other than breast milk if you are breastfeeding    Comments: Make the baby less content with breastfeeds, may make my baby more susceptible to illness, and may reduce my milk supply.   - Nonpharmacological pain relief methods for labor    Comments: Deep breathing, focusing on pleasant things, movement and walking, heating pads or cold compress, massage and relaxation, continuous support from someone you trust, and Doulas   - Rooming-in on a 24-hour basis    Comments: Easier to learn baby's feeding cues, easier to bond and get to know each other, and encourages milk production.   - Feeding on demand or baby-led feeding    Comments: Helps prevent breastfeeding complications, helps bring in good milk supply, prevents under or overfeeding, and helps baby feel content and satisfied   - Frequent feeding to help assure optimal milk production    Comments: Making a full supply of milk requires frequent removal of milk from breasts, infant will eat 8-12 times in 24 hours, if separated from infant use breast massage, hand expression and/ or pumping to remove milk from breasts.   - Effective positioning and attachment    Comments: Helps my baby to get enough breast milk, helps to produce an adequate milk supply, and helps prevent nipple pain and damage   - Individualized Education    Comments: Contraindications to breastfeeding and other special medical conditions

## 2021-01-10 ENCOUNTER — Other Ambulatory Visit: Payer: Self-pay

## 2021-01-10 ENCOUNTER — Ambulatory Visit (INDEPENDENT_AMBULATORY_CARE_PROVIDER_SITE_OTHER): Payer: BC Managed Care – PPO | Admitting: Obstetrics and Gynecology

## 2021-01-10 ENCOUNTER — Encounter: Payer: Self-pay | Admitting: Obstetrics and Gynecology

## 2021-01-10 VITALS — BP 115/80 | HR 89 | Wt 166.0 lb

## 2021-01-10 DIAGNOSIS — Z3A34 34 weeks gestation of pregnancy: Secondary | ICD-10-CM

## 2021-01-10 DIAGNOSIS — Z0289 Encounter for other administrative examinations: Secondary | ICD-10-CM

## 2021-01-10 DIAGNOSIS — Z3403 Encounter for supervision of normal first pregnancy, third trimester: Secondary | ICD-10-CM

## 2021-01-10 LAB — POCT URINALYSIS DIPSTICK OB
Bilirubin, UA: NEGATIVE
Blood, UA: NEGATIVE
Glucose, UA: NEGATIVE
Ketones, UA: NEGATIVE
Leukocytes, UA: NEGATIVE
Nitrite, UA: NEGATIVE
POC,PROTEIN,UA: NEGATIVE
Spec Grav, UA: 1.01 (ref 1.010–1.025)
Urobilinogen, UA: 0.2 E.U./dL
pH, UA: 6.5 (ref 5.0–8.0)

## 2021-01-10 NOTE — Progress Notes (Signed)
ROB: No complaints.  Describes daily movement.  Denies contractions.  Discussed breast-feeding website and birth plan.

## 2021-01-24 ENCOUNTER — Encounter: Payer: Self-pay | Admitting: Obstetrics and Gynecology

## 2021-01-24 ENCOUNTER — Ambulatory Visit (INDEPENDENT_AMBULATORY_CARE_PROVIDER_SITE_OTHER): Payer: BC Managed Care – PPO | Admitting: Obstetrics and Gynecology

## 2021-01-24 ENCOUNTER — Other Ambulatory Visit: Payer: Self-pay

## 2021-01-24 VITALS — BP 121/80 | HR 69 | Ht 63.0 in | Wt 169.1 lb

## 2021-01-24 DIAGNOSIS — Z3A36 36 weeks gestation of pregnancy: Secondary | ICD-10-CM

## 2021-01-24 DIAGNOSIS — Z3403 Encounter for supervision of normal first pregnancy, third trimester: Secondary | ICD-10-CM

## 2021-01-24 LAB — POCT URINALYSIS DIPSTICK OB
Bilirubin, UA: NEGATIVE
Blood, UA: NEGATIVE
Glucose, UA: NEGATIVE
Ketones, UA: NEGATIVE
Leukocytes, UA: NEGATIVE
Nitrite, UA: NEGATIVE
POC,PROTEIN,UA: NEGATIVE
Spec Grav, UA: 1.025 (ref 1.010–1.025)
Urobilinogen, UA: 0.2 E.U./dL
pH, UA: 7 (ref 5.0–8.0)

## 2021-01-24 LAB — OB RESULTS CONSOLE GC/CHLAMYDIA: Gonorrhea: NEGATIVE

## 2021-01-24 NOTE — Patient Instructions (Signed)
Signs and Symptoms of Labor Labor is the body's natural process of moving the baby and the placenta out of the uterus. The process of labor usually starts when the baby is full-term, between 37 and 40 weeks of pregnancy. Signs and symptoms that you are close to going into labor As your body prepares for labor and the birth of your baby, you may notice the following symptoms in the weeks and days before true labor starts:  Passing a small amount of thick, bloody mucus from your vagina. This is called normal bloody show or losing your mucus plug. This may happen more than a week before labor begins, or right before labor begins, as the opening of the cervix starts to widen (dilate). For some women, the entire mucus plug passes at once. For others, pieces of the mucus plug may gradually pass over several days.  Your baby moving (dropping) lower in your pelvis to get into position for birth (lightening). When this happens, you may feel more pressure on your bladder and pelvic bone and less pressure on your ribs. This may make it easier to breathe. It may also cause you to need to urinate more often and have problems with bowel movements.  Having "practice contractions," also called Braxton Hicks contractions or false labor. These occur at irregular (unevenly spaced) intervals that are more than 10 minutes apart. False labor contractions are common after exercise or sexual activity. They will stop if you change position, rest, or drink fluids. These contractions are usually mild and do not get stronger over time. They may feel like: ? A backache or back pain. ? Mild cramps, similar to menstrual cramps. ? Tightening or pressure in your abdomen. Other early symptoms include:  Nausea or loss of appetite.  Diarrhea.  Having a sudden burst of energy, or feeling very tired.  Mood changes.  Having trouble sleeping.   Signs and symptoms that labor has begun Signs that you are in labor may  include:  Having contractions that come at regular (evenly spaced) intervals and increase in intensity. This may feel like more intense tightening or pressure in your abdomen that moves to your back. ? Contractions may also feel like rhythmic pain in your upper thighs or back that comes and goes at regular intervals. ? For first-time mothers, this change in intensity of contractions often occurs at a more gradual pace. ? Women who have given birth before may notice a more rapid progression of contraction changes.  Feeling pressure in the vaginal area.  Your water breaking (rupture of membranes). This is when the sac of fluid that surrounds your baby breaks. Fluid leaking from your vagina may be clear or blood-tinged. Labor usually starts within 24 hours of your water breaking, but it may take longer to begin. ? Some women may feel a sudden gush of fluid. ? Others notice that their underwear repeatedly becomes damp. Follow these instructions at home:  When labor starts, or if your water breaks, call your health care provider or nurse care line. Based on your situation, they will determine when you should go in for an exam.  During early labor, you may be able to rest and manage symptoms at home. Some strategies to try at home include: ? Breathing and relaxation techniques. ? Taking a warm bath or shower. ? Listening to music. ? Using a heating pad on the lower back for pain. If you are directed to use heat:  Place a towel between your skin and the   heat source.  Leave the heat on for 20-30 minutes.  Remove the heat if your skin turns bright red. This is especially important if you are unable to feel pain, heat, or cold. You may have a greater risk of getting burned.   Contact a health care provider if:  Your labor has started.  Your water breaks. Get help right away if:  You have painful, regular contractions that are 5 minutes apart or less.  Labor starts before you are [redacted] weeks  along in your pregnancy.  You have a fever.  You have bright red blood coming from your vagina.  You do not feel your baby moving.  You have a severe headache with or without vision problems.  You have severe nausea, vomiting, or diarrhea.  You have chest pain or shortness of breath. These symptoms may represent a serious problem that is an emergency. Do not wait to see if the symptoms will go away. Get medical help right away. Call your local emergency services (911 in the [place] Do not drive yourself to the hospital. Summary  Labor is your body's natural process of moving your baby and the placenta out of your uterus.  The process of labor usually starts when your baby is full-term, between 11 and 40 weeks of pregnancy.  When labor starts, or if your water breaks, call your health care provider or nurse care line. Based on your situation, they will determine when you should go in for an exam. This information is not intended to replace advice given to you by your health care provider. Make sure you discuss any questions you have with your health care provider. Document Revised: 08/27/2020 Document Reviewed: 08/27/2020 Elsevier Patient Education  2021 [employer].   Labor Induction Labor induction is when steps are taken to cause a pregnant woman to begin the labor process. Most women go into labor on their own between 37 weeks and 42 weeks of pregnancy. When this does not happen, or when there is a medical need for labor to begin, steps may be taken to induce, or bring on, labor. Labor induction causes a pregnant woman's uterus to contract. It also causes the cervix to soften (ripen), open (dilate), and thin out. Usually, labor is not induced before 39 weeks of pregnancy unless there is a medical reason to do so. When is labor induction considered? Labor induction may be right for you if:  Your pregnancy lasts longer than 41 to 42 weeks.  Your placenta is separating from your  uterus (placental abruption).  You have a rupture of membranes and your labor does not begin.  You have health problems, like diabetes or high blood pressure (preeclampsia) during your pregnancy.  Your baby has stopped growing or does not have enough amniotic fluid. Before labor induction begins, your health care provider will consider the following factors:  Your medical condition and the baby's condition.  How many weeks you have been pregnant.  How mature the baby's lungs are.  The condition of your cervix.  The position of the baby.  The size of your birth canal. Tell a health care provider about:  Any allergies you have.  All medicines you are taking, including vitamins, herbs, eye drops, creams, and over-the-counter medicines.  Any problems you or your family members have had with anesthetic medicines.  Any surgeries you have had.  Any blood disorders you have.  Any medical conditions you have. What are the risks? Generally, this is a safe procedure. However,  problems may occur, including:  Failed induction.  Changes in fetal heart rate, such as being too high, too low, or irregular (erratic).  Infection in the mother or the baby.  Increased risk of having a cesarean delivery.  Breaking off (abruption) of the placenta from the uterus. This is rare.  Rupture of the uterus. This is very rare.  Your baby could fail to get enough blood flow or oxygen. This can be life-threatening. When induction is needed for medical reasons, the benefits generally outweigh the risks. What happens during the procedure? During the procedure, your health care provider will use one of these methods to induce labor:  Stripping the membranes. In this method, the amniotic sac tissue is gently separated from the cervix. This causes the following to happen: ? Your cervix stretches, which in turn causes the release of prostaglandins. ? Prostaglandins induce labor and cause the uterus  to contract. ? This procedure is often done in an office visit. You will be sent home to wait for contractions to begin.  Prostaglandin medicine. This medicine starts contractions and causes the cervix to dilate and ripen. This can be taken by mouth (orally) or by being inserted into the vagina (suppository).  Inserting a small, thin tube (catheter) with a balloon into the vagina and then expanding the balloon with water to dilate the cervix.  Breaking the water. In this method, a small instrument is used to make a small hole in the amniotic sac. This eventually causes the amniotic sac to break. Contractions should begin within a few hours.  Medicine to trigger or strengthen contractions. This medicine is given through an IV that is inserted into a vein in your arm. This procedure may vary among health care providers and hospitals.   Where to find more information  March of Dimes: www.marchofdimes.org  The [employer] of [employer]ians [employer]nec[employer]: www.acog.org Summary  Labor induction causes a pregnant woman's uterus to contract. It also causes the cervix to soften (ripen), open (dilate), and thin out.  Labor is usually not induced before 39 weeks of pregnancy unless there is a medical reason to do so.  When induction is needed for medical reasons, the benefits generally outweigh the risks.  Talk with your health care provider about which methods of labor induction are right for you. This information is not intended to replace advice given to you by your health care provider. Make sure you discuss any questions you have with your health care provider. Document Revised: 08/18/2020 Document Reviewed: 08/18/2020 Elsevier Patient Education  2021 Elsevier Inc.    [facility]  [address] [place], [place], [place] [address]  Phone: [phone]   [facility] (second location)  [address] [place], [place]  [address]  Phone: [phone]   [facility]) [address] [place], [place], [place] [address] Phone: [phone]   [facility]  [address], [place], [place] [address]  Phone: [phone]

## 2021-01-24 NOTE — Progress Notes (Signed)
ROB: Patient has multiple general questions regarding upcoming labor and delivery in the next several weeks, including visitor policy at hospital, process of IOL if needed, perineal tears, lactation, etc. 36 week cultures performed today.  RTC in 1 week.

## 2021-01-24 NOTE — Progress Notes (Signed)
OB-Pt present for routine prenatal care. Pt c/o vaginal pressure no other issues.

## 2021-01-25 ENCOUNTER — Encounter: Payer: Self-pay | Admitting: Obstetrics and Gynecology

## 2021-01-26 LAB — GC/CHLAMYDIA PROBE AMP
Chlamydia trachomatis, NAA: NEGATIVE
Neisseria Gonorrhoeae by PCR: NEGATIVE

## 2021-01-26 LAB — STREP GP B NAA: Strep Gp B NAA: NEGATIVE

## 2021-01-31 ENCOUNTER — Ambulatory Visit (INDEPENDENT_AMBULATORY_CARE_PROVIDER_SITE_OTHER): Payer: BC Managed Care – PPO | Admitting: Obstetrics and Gynecology

## 2021-01-31 ENCOUNTER — Other Ambulatory Visit: Payer: Self-pay

## 2021-01-31 ENCOUNTER — Encounter: Payer: Self-pay | Admitting: Obstetrics and Gynecology

## 2021-01-31 VITALS — BP 120/76 | HR 73 | Wt 169.9 lb

## 2021-01-31 DIAGNOSIS — Z3403 Encounter for supervision of normal first pregnancy, third trimester: Secondary | ICD-10-CM

## 2021-01-31 DIAGNOSIS — Z3A37 37 weeks gestation of pregnancy: Secondary | ICD-10-CM

## 2021-01-31 LAB — POCT URINALYSIS DIPSTICK OB
Bilirubin, UA: NEGATIVE
Blood, UA: NEGATIVE
Glucose, UA: NEGATIVE
Ketones, UA: NEGATIVE
Leukocytes, UA: NEGATIVE
Nitrite, UA: NEGATIVE
POC,PROTEIN,UA: NEGATIVE
Spec Grav, UA: 1.015 (ref 1.010–1.025)
Urobilinogen, UA: 0.2 E.U./dL
pH, UA: 7 (ref 5.0–8.0)

## 2021-01-31 NOTE — Progress Notes (Signed)
ROB: No complaints today.  No further questions regarding labor and delivery.  Culture results reviewed with the patient.

## 2021-02-07 ENCOUNTER — Encounter: Payer: BC Managed Care – PPO | Admitting: Obstetrics and Gynecology

## 2021-02-07 ENCOUNTER — Other Ambulatory Visit: Payer: Self-pay

## 2021-02-07 ENCOUNTER — Encounter: Payer: Self-pay | Admitting: Obstetrics and Gynecology

## 2021-02-07 ENCOUNTER — Ambulatory Visit (INDEPENDENT_AMBULATORY_CARE_PROVIDER_SITE_OTHER): Payer: BC Managed Care – PPO | Admitting: Obstetrics and Gynecology

## 2021-02-07 VITALS — BP 116/76 | HR 73 | Wt 171.7 lb

## 2021-02-07 DIAGNOSIS — O480 Post-term pregnancy: Secondary | ICD-10-CM

## 2021-02-07 DIAGNOSIS — Z3A38 38 weeks gestation of pregnancy: Secondary | ICD-10-CM

## 2021-02-07 DIAGNOSIS — Z3483 Encounter for supervision of other normal pregnancy, third trimester: Secondary | ICD-10-CM

## 2021-02-07 LAB — POCT URINALYSIS DIPSTICK OB
Bilirubin, UA: NEGATIVE
Blood, UA: NEGATIVE
Glucose, UA: NEGATIVE
Ketones, UA: NEGATIVE
Leukocytes, UA: NEGATIVE
Nitrite, UA: NEGATIVE
POC,PROTEIN,UA: NEGATIVE
Spec Grav, UA: 1.02 (ref 1.010–1.025)
Urobilinogen, UA: 0.2 E.U./dL
pH, UA: 7 (ref 5.0–8.0)

## 2021-02-07 NOTE — Progress Notes (Signed)
OB-Pt present for routine prenatal care. Pt c/o lower back pain and braxton hick contractions.

## 2021-02-07 NOTE — Patient Instructions (Signed)

## 2021-02-07 NOTE — Progress Notes (Signed)
ROB: Patient with no major issues. Does note general complaints of pregnancy (low back pain, Vanessa Cordova). Reports needing breast pump prescription.  Answered general questions regarding labor, postdates pregnancy if she goes beyond.  RTC in 1 week. For NST/AFI postdates if applicable in 2 weeks.

## 2021-02-08 ENCOUNTER — Telehealth: Payer: Self-pay | Admitting: Obstetrics and Gynecology

## 2021-02-08 NOTE — Telephone Encounter (Signed)
Tried to get this patient scheduled for [place]- her apt is April 18th is this too far out?

## 2021-02-09 NOTE — Telephone Encounter (Signed)
Spoke with Tasha in Centralized Scheduling she states that 4-18 is the closest [place] apt available. Is there anything else I can do?

## 2021-02-09 NOTE — Telephone Encounter (Signed)
Yes, that is too far.   Per [facility] need to be done by 4/5.

## 2021-02-09 NOTE — Telephone Encounter (Signed)
Don't worry about the ultrasound. Just cancel it. We will just need to do an NST in the office. If I am concerned, I can always check her fluid in the office if she goes postdates.

## 2021-02-10 NOTE — Telephone Encounter (Signed)
Spoke with pt made her aware we are cancelling the [place], she is scheduled for NST on 04-05- would you want another NST sooner like the beginning of next week?

## 2021-02-14 ENCOUNTER — Encounter: Payer: Self-pay | Admitting: Obstetrics and Gynecology

## 2021-02-14 ENCOUNTER — Ambulatory Visit (INDEPENDENT_AMBULATORY_CARE_PROVIDER_SITE_OTHER): Payer: BC Managed Care – PPO | Admitting: Obstetrics and Gynecology

## 2021-02-14 ENCOUNTER — Other Ambulatory Visit: Payer: Self-pay

## 2021-02-14 VITALS — BP 123/84 | HR 84 | Wt 176.2 lb

## 2021-02-14 DIAGNOSIS — Z3A39 39 weeks gestation of pregnancy: Secondary | ICD-10-CM

## 2021-02-14 DIAGNOSIS — Z3483 Encounter for supervision of other normal pregnancy, third trimester: Secondary | ICD-10-CM

## 2021-02-14 NOTE — Progress Notes (Signed)
ROB: No complaints.  Denies contractions.  Reports fetal movement.  NST next week.  Induction scheduled for MN 8th.  Covid test on 8th after NST visit.

## 2021-02-19 ENCOUNTER — Inpatient Hospital Stay: Admit: 2021-02-19 | Payer: Self-pay

## 2021-02-21 ENCOUNTER — Other Ambulatory Visit: Payer: Self-pay

## 2021-02-21 ENCOUNTER — Ambulatory Visit (INDEPENDENT_AMBULATORY_CARE_PROVIDER_SITE_OTHER): Payer: BC Managed Care – PPO | Admitting: Obstetrics and Gynecology

## 2021-02-21 ENCOUNTER — Encounter: Payer: Self-pay | Admitting: Obstetrics and Gynecology

## 2021-02-21 ENCOUNTER — Other Ambulatory Visit: Payer: BC Managed Care – PPO

## 2021-02-21 VITALS — BP 117/74 | HR 85 | Wt 175.6 lb

## 2021-02-21 DIAGNOSIS — Z3A40 40 weeks gestation of pregnancy: Secondary | ICD-10-CM | POA: Diagnosis not present

## 2021-02-21 DIAGNOSIS — O480 Post-term pregnancy: Secondary | ICD-10-CM | POA: Diagnosis not present

## 2021-02-21 LAB — POCT URINALYSIS DIPSTICK OB
Bilirubin, UA: NEGATIVE
Blood, UA: NEGATIVE
Glucose, UA: NEGATIVE
Ketones, UA: NEGATIVE
Leukocytes, UA: NEGATIVE
Nitrite, UA: NEGATIVE
POC,PROTEIN,UA: NEGATIVE
Spec Grav, UA: 1.02 (ref 1.010–1.025)
Urobilinogen, UA: 0.2 E.U./dL
pH, UA: 6.5 (ref 5.0–8.0)

## 2021-02-21 NOTE — Patient Instructions (Signed)
Labor Induction Labor induction is when steps are taken to cause a pregnant woman to begin the labor process. Most women go into labor on their own between 37 weeks and 42 weeks of pregnancy. When this does not happen, or when there is a medical need for labor to begin, steps may be taken to induce, or bring on, labor. Labor induction causes a pregnant woman's uterus to contract. It also causes the cervix to soften (ripen), open (dilate), and thin out. Usually, labor is not induced before 39 weeks of pregnancy unless there is a medical reason to do so. When is labor induction considered? Labor induction may be right for you if:  Your pregnancy lasts longer than 41 to 42 weeks.  Your placenta is separating from your uterus (placental abruption).  You have a rupture of membranes and your labor does not begin.  You have health problems, like diabetes or high blood pressure (preeclampsia) during your pregnancy.  Your baby has stopped growing or does not have enough amniotic fluid. Before labor induction begins, your health care provider will consider the following factors:  Your medical condition and the baby's condition.  How many weeks you have been pregnant.  How mature the baby's lungs are.  The condition of your cervix.  The position of the baby.  The size of your birth canal. Tell a health care provider about:  Any allergies you have.  All medicines you are taking, including vitamins, herbs, eye drops, creams, and over-the-counter medicines.  Any problems you or your family members have had with anesthetic medicines.  Any surgeries you have had.  Any blood disorders you have.  Any medical conditions you have. What are the risks? Generally, this is a safe procedure. However, problems may occur, including:  Failed induction.  Changes in fetal heart rate, such as being too high, too low, or irregular (erratic).  Infection in the mother or the baby.  Increased risk of  having a cesarean delivery.  Breaking off (abruption) of the placenta from the uterus. This is rare.  Rupture of the uterus. This is very rare.  Your baby could fail to get enough blood flow or oxygen. This can be life-threatening. When induction is needed for medical reasons, the benefits generally outweigh the risks. What happens during the procedure? During the procedure, your health care provider will use one of these methods to induce labor:  Stripping the membranes. In this method, the amniotic sac tissue is gently separated from the cervix. This causes the following to happen: ? Your cervix stretches, which in turn causes the release of prostaglandins. ? Prostaglandins induce labor and cause the uterus to contract. ? This procedure is often done in an office visit. You will be sent home to wait for contractions to begin.  Prostaglandin medicine. This medicine starts contractions and causes the cervix to dilate and ripen. This can be taken by mouth (orally) or by being inserted into the vagina (suppository).  Inserting a small, thin tube (catheter) with a balloon into the vagina and then expanding the balloon with water to dilate the cervix.  Breaking the water. In this method, a small instrument is used to make a small hole in the amniotic sac. This eventually causes the amniotic sac to break. Contractions should begin within a few hours.  Medicine to trigger or strengthen contractions. This medicine is given through an IV that is inserted into a vein in your arm. This procedure may vary among health care providers and hospitals.     Where to find more information  March of Dimes: www.marchofdimes.org  The American College of Obstetricians and Gynecologists: www.acog.org Summary  Labor induction causes a pregnant woman's uterus to contract. It also causes the cervix to soften (ripen), open (dilate), and thin out.  Labor is usually not induced before 39 weeks of pregnancy unless  there is a medical reason to do so.  When induction is needed for medical reasons, the benefits generally outweigh the risks.  Talk with your health care provider about which methods of labor induction are right for you. This information is not intended to replace advice given to you by your health care provider. Make sure you discuss any questions you have with your health care provider. Document Revised: 08/18/2020 Document Reviewed: 08/18/2020 Elsevier Patient Education  2021 Elsevier Inc.  

## 2021-02-21 NOTE — Progress Notes (Signed)
OB PT present for NST and prenatal care. Pt stated having braxton hick contractions no other issues.

## 2021-02-21 NOTE — Progress Notes (Signed)
ROB: Patient doing well, no issues.  Notes some Vanessa Cordova. Discussed IOL in more detail.  For COVID screening today.  NST performed today was reviewed and was found to be reactive.  Continue recommended antenatal testing and prenatal care.   NONSTRESS TEST INTERPRETATION  INDICATIONS: Post-dates pregnancy  FHR baseline: 140 bpm RESULTS:Reactive COMMENTS: Uterine irritability, no contractions.    PLAN: 1. Continue fetal kick counts twice a day. 2. Scheduled for IOL on on Friday at midnight.     Hildred Laser, MD [facility]

## 2021-02-24 ENCOUNTER — Inpatient Hospital Stay
Admission: EM | Admit: 2021-02-24 | Discharge: 2021-02-26 | DRG: 807 | Disposition: A | Discharge status: Home / Self Care | Payer: BC Managed Care – PPO | Attending: Obstetrics and Gynecology | Admitting: Obstetrics and Gynecology

## 2021-02-24 ENCOUNTER — Encounter: Payer: Self-pay | Admitting: Obstetrics and Gynecology

## 2021-02-24 ENCOUNTER — Inpatient Hospital Stay: Payer: BC Managed Care – PPO | Admitting: Anesthesiology

## 2021-02-24 ENCOUNTER — Other Ambulatory Visit: Payer: Self-pay

## 2021-02-24 DIAGNOSIS — Z3A40 40 weeks gestation of pregnancy: Secondary | ICD-10-CM | POA: Diagnosis not present

## 2021-02-24 DIAGNOSIS — Z87898 Personal history of other specified conditions: Secondary | ICD-10-CM

## 2021-02-24 DIAGNOSIS — Z20822 Contact with and (suspected) exposure to covid-19: Secondary | ICD-10-CM | POA: Diagnosis present

## 2021-02-24 DIAGNOSIS — O480 Post-term pregnancy: Principal | ICD-10-CM

## 2021-02-24 DIAGNOSIS — E221 Hyperprolactinemia: Secondary | ICD-10-CM

## 2021-02-24 LAB — TYPE AND SCREEN
ABO/RH(D): A POS
Antibody Screen: NEGATIVE

## 2021-02-24 LAB — RAPID HIV SCREEN (HIV 1/2 AB+AG)
HIV 1/2 Antibodies: NONREACTIVE
HIV-1 P24 Antigen - HIV24: NONREACTIVE

## 2021-02-24 LAB — CBC
HCT: 38.6 % (ref 36.0–46.0)
Hemoglobin: 13.3 g/dL (ref 12.0–15.0)
MCH: 31.4 pg (ref 26.0–34.0)
MCHC: 34.5 g/dL (ref 30.0–36.0)
MCV: 91 fL (ref 80.0–100.0)
Platelets: 172 10*3/uL (ref 150–400)
RBC: 4.24 MIL/uL (ref 3.87–5.11)
RDW: 12.6 % (ref 11.5–15.5)
WBC: 10.6 10*3/uL — ABNORMAL HIGH (ref 4.0–10.5)
nRBC: 0 % (ref 0.0–0.2)

## 2021-02-24 LAB — RESP PANEL BY RT-PCR (FLU A&B, COVID) ARPGX2
Influenza A by PCR: NEGATIVE
Influenza B by PCR: NEGATIVE
SARS Coronavirus 2 by RT PCR: NEGATIVE

## 2021-02-24 LAB — ABO/RH: ABO/RH(D): A POS

## 2021-02-24 LAB — RPR: RPR Ser Ql: NONREACTIVE

## 2021-02-24 MED ORDER — SODIUM CHLORIDE 0.9 % IV SOLN
1.0000 g | INTRAVENOUS | Status: DC
  Filled 2021-02-24 (×4): qty 1000

## 2021-02-24 MED ORDER — COCONUT OIL OIL
1.0000 "application " | TOPICAL_OIL | Status: DC | PRN
  Administered 2021-02-24: 1 via TOPICAL
  Filled 2021-02-24: qty 120

## 2021-02-24 MED ORDER — ACETAMINOPHEN 325 MG PO TABS: 650.0000 mg | ORAL_TABLET | ORAL | Status: DC | PRN

## 2021-02-24 MED ORDER — LIDOCAINE HCL (PF) 1 % IJ SOLN
INTRAMUSCULAR | Status: DC | PRN
  Administered 2021-02-24: 2 mL

## 2021-02-24 MED ORDER — MISOPROSTOL 25 MCG QUARTER TABLET
50.0000 ug | ORAL_TABLET | ORAL | Status: DC | PRN
  Administered 2021-02-24: 50 ug via VAGINAL
  Filled 2021-02-24 (×2): qty 1
  Filled 2021-02-24: qty 2

## 2021-02-24 MED ORDER — OXYTOCIN BOLUS FROM INFUSION
333.0000 mL | Freq: Once | INTRAVENOUS | Status: AC
  Administered 2021-02-24: 333 mL via INTRAVENOUS

## 2021-02-24 MED ORDER — ONDANSETRON HCL 4 MG/2ML IJ SOLN: 4.0000 mg | INTRAMUSCULAR | Status: DC | PRN

## 2021-02-24 MED ORDER — LIDOCAINE HCL (PF) 1 % IJ SOLN: 30.0000 mL | INTRAMUSCULAR | Status: DC | PRN

## 2021-02-24 MED ORDER — PRENATAL MULTIVITAMIN CH
1.0000 | ORAL_TABLET | Freq: Every day | ORAL | Status: DC
  Administered 2021-02-25 – 2021-02-26 (×2): 1 via ORAL
  Filled 2021-02-24 (×2): qty 1

## 2021-02-24 MED ORDER — PHENYLEPHRINE 40 MCG/ML (10ML) SYRINGE FOR IV PUSH (FOR BLOOD PRESSURE SUPPORT)
80.0000 ug | PREFILLED_SYRINGE | INTRAVENOUS | Status: DC | PRN
  Filled 2021-02-24: qty 10

## 2021-02-24 MED ORDER — LIDOCAINE-EPINEPHRINE (PF) 1.5 %-1:200000 IJ SOLN
INTRAMUSCULAR | Status: DC | PRN
  Administered 2021-02-24: 3 mL via PERINEURAL

## 2021-02-24 MED ORDER — BUPIVACAINE HCL (PF) 0.25 % IJ SOLN
INTRAMUSCULAR | Status: DC | PRN
  Administered 2021-02-24 (×2): 3 mL via EPIDURAL

## 2021-02-24 MED ORDER — EPHEDRINE 5 MG/ML INJ
10.0000 mg | INTRAVENOUS | Status: DC | PRN
  Filled 2021-02-24: qty 2

## 2021-02-24 MED ORDER — MISOPROSTOL 200 MCG PO TABS
800.0000 ug | ORAL_TABLET | Freq: Once | ORAL | Status: AC
  Administered 2021-02-24: 800 ug via RECTAL

## 2021-02-24 MED ORDER — OXYTOCIN-SODIUM CHLORIDE 30-0.9 UT/500ML-% IV SOLN: 1.0000 m[IU]/min | INTRAVENOUS | Status: DC

## 2021-02-24 MED ORDER — WITCH HAZEL-GLYCERIN EX PADS: 1.0000 "application " | MEDICATED_PAD | CUTANEOUS | Status: DC | PRN

## 2021-02-24 MED ORDER — OXYTOCIN-SODIUM CHLORIDE 30-0.9 UT/500ML-% IV SOLN
INTRAVENOUS | Status: AC
  Filled 2021-02-24: qty 500

## 2021-02-24 MED ORDER — SIMETHICONE 80 MG PO CHEW: 80.0000 mg | CHEWABLE_TABLET | ORAL | Status: DC | PRN

## 2021-02-24 MED ORDER — ONDANSETRON HCL 4 MG PO TABS: 4.0000 mg | ORAL_TABLET | ORAL | Status: DC | PRN

## 2021-02-24 MED ORDER — BENZOCAINE-MENTHOL 20-0.5 % EX AERO
1.0000 "application " | INHALATION_SPRAY | CUTANEOUS | Status: DC | PRN
  Administered 2021-02-24: 1 via TOPICAL
  Filled 2021-02-24: qty 56

## 2021-02-24 MED ORDER — ONDANSETRON HCL 4 MG/2ML IJ SOLN: 4.0000 mg | Freq: Four times a day (QID) | INTRAMUSCULAR | Status: DC | PRN

## 2021-02-24 MED ORDER — LACTATED RINGERS IV SOLN: INTRAVENOUS | Status: DC

## 2021-02-24 MED ORDER — DIBUCAINE (PERIANAL) 1 % EX OINT: 1.0000 "application " | TOPICAL_OINTMENT | CUTANEOUS | Status: DC | PRN

## 2021-02-24 MED ORDER — SOD CITRATE-CITRIC ACID 500-334 MG/5ML PO SOLN: 30.0000 mL | ORAL | Status: DC | PRN

## 2021-02-24 MED ORDER — OXYTOCIN-SODIUM CHLORIDE 30-0.9 UT/500ML-% IV SOLN
2.5000 [IU]/h | INTRAVENOUS | Status: DC
  Administered 2021-02-24: 2.5 [IU]/h via INTRAVENOUS

## 2021-02-24 MED ORDER — IBUPROFEN 600 MG PO TABS
600.0000 mg | ORAL_TABLET | Freq: Four times a day (QID) | ORAL | Status: DC
  Administered 2021-02-24 – 2021-02-26 (×7): 600 mg via ORAL
  Filled 2021-02-24 (×7): qty 1

## 2021-02-24 MED ORDER — LACTATED RINGERS IV SOLN: 500.0000 mL | Freq: Once | INTRAVENOUS | Status: DC

## 2021-02-24 MED ORDER — DIPHENHYDRAMINE HCL 25 MG PO CAPS: 25.0000 mg | ORAL_CAPSULE | Freq: Four times a day (QID) | ORAL | Status: DC | PRN

## 2021-02-24 MED ORDER — TERBUTALINE SULFATE 1 MG/ML IJ SOLN: 0.2500 mg | Freq: Once | INTRAMUSCULAR | Status: DC | PRN

## 2021-02-24 MED ORDER — LACTATED RINGERS IV SOLN: 500.0000 mL | INTRAVENOUS | Status: DC | PRN

## 2021-02-24 MED ORDER — BUTORPHANOL TARTRATE 1 MG/ML IJ SOLN: 1.0000 mg | INTRAMUSCULAR | Status: DC | PRN

## 2021-02-24 MED ORDER — FENTANYL 2.5 MCG/ML W/ROPIVACAINE 0.15% IN NS 100 ML EPIDURAL (ARMC)
EPIDURAL | Status: AC
  Filled 2021-02-24: qty 100

## 2021-02-24 MED ORDER — FENTANYL 2.5 MCG/ML W/ROPIVACAINE 0.15% IN NS 100 ML EPIDURAL (ARMC)
12.0000 mL/h | EPIDURAL | Status: DC
  Administered 2021-02-24: 12 mL/h via EPIDURAL

## 2021-02-24 MED ORDER — SENNOSIDES-DOCUSATE SODIUM 8.6-50 MG PO TABS
2.0000 | ORAL_TABLET | Freq: Every day | ORAL | Status: DC
  Filled 2021-02-24 (×2): qty 2

## 2021-02-24 MED ORDER — SODIUM CHLORIDE 0.9 % IV SOLN: 2.0000 g | Freq: Once | INTRAVENOUS | Status: DC

## 2021-02-24 MED ORDER — ZOLPIDEM TARTRATE 5 MG PO TABS: 5.0000 mg | ORAL_TABLET | Freq: Every evening | ORAL | Status: DC | PRN

## 2021-02-24 MED ORDER — DIPHENHYDRAMINE HCL 50 MG/ML IJ SOLN: 12.5000 mg | INTRAMUSCULAR | Status: DC | PRN

## 2021-02-24 NOTE — Progress Notes (Signed)
Intrapartum Progress Note  S: Patient sitting up to get epidural. No major concerns  O: Blood pressure 112/71, pulse 76, temperature 98.3 F (36.8 C), temperature source Oral, resp. rate 18, height 5\' 3"  (1.6 m), weight 77.1 kg, SpO2 99 %. Gen App: NAD, comfortable Abdomen: soft, gravid FHT: baseline 135 bpm.  Accels present.  Decels absent. moderate in degree variability.   Tocometer: contractions q 3-4 minutes Cervix: deferred as patient sitting up for epidural.  Extremities: Nontender, no edema.  Pitocin: None ordered  Labs:  No new labs  Assessment:  1: SIUP at [redacted]w[redacted]d 2. IOL for postdates 3. GBS negative  Plan:  1. Continue expectant management. For Pitocin augmentation if needed.  2. For epidural placement.  3. Anticipate vaginal delivery   [redacted]w[redacted]d, MD 02/24/2021 2:09 PM

## 2021-02-24 NOTE — Anesthesia Procedure Notes (Signed)
Epidural Patient location during procedure: OB Start time: 02/24/2021 12:05 PM End time: 02/24/2021 12:17 PM  Staffing Anesthesiologist: Lenard Simmer, MD Resident/CRNA: Hezzie Bump, CRNA Performed: resident/CRNA   Preanesthetic Checklist Completed: patient identified, IV checked, site marked, risks and benefits discussed, surgical consent, monitors and equipment checked, pre-op evaluation and timeout performed  Epidural Patient position: sitting Prep: ChloraPrep Patient monitoring: heart rate, continuous pulse ox and blood pressure Approach: midline Location: L3-L4 Injection technique: LOR saline  Needle:  Needle type: Tuohy  Needle gauge: 17 G Needle length: 9 cm and 9 Needle insertion depth: 6 cm Catheter type: closed end flexible Catheter size: 19 Gauge Catheter at skin depth: 12 cm Test dose: negative and 1.5% lidocaine with Epi 1:200 K  Assessment Sensory level: T10 Events: blood not aspirated, injection not painful, no injection resistance, no paresthesia and negative IV test  Additional Notes 1 attempt Pt. Evaluated and documentation done after procedure finished. Patient identified. Risks/Benefits/Options discussed with patient including but not limited to bleeding, infection, nerve damage, paralysis, failed block, incomplete pain control, headache, blood pressure changes, nausea, vomiting, reactions to medication both or allergic, itching and postpartum back pain. Confirmed with bedside nurse the patient's most recent platelet count. Confirmed with patient that they are not currently taking any anticoagulation, have any bleeding history or any family history of bleeding disorders. Patient expressed understanding and wished to proceed. All questions were answered. Sterile technique was used throughout the entire procedure. Please see nursing notes for vital signs. Test dose was given through epidural catheter and negative prior to continuing to dose epidural or start  infusion. Warning signs of high block given to the patient including shortness of breath, tingling/numbness in hands, complete motor block, or any concerning symptoms with instructions to call for help. Patient was given instructions on fall risk and not to get out of bed. All questions and concerns addressed with instructions to call with any issues or inadequate analgesia.   Patient tolerated the insertion well without immediate complications.Reason for block:procedure for pain

## 2021-02-24 NOTE — H&P (Signed)
Obstetric History and Physical  Vanessa Cordova is a 30 y.o. G1P0000 with IUP at [redacted]w[redacted]d presenting for scheduled IOL for post-dates pregnancy. Patient states she has been having  occasional contractions, no vaginal bleeding, intact membranes, with active fetal movement.    Prenatal Course Source of Care: [facility] with onset of care at 1 weeks Pregnancy complications or risks: Patient Active Problem List   Diagnosis Date Noted  . Post-dates pregnancy 02/24/2021  . Hyperprolactinemia (HCC) 04/14/2020   She plans to breastfeed She desires undecided method for postpartum contraception.   Prenatal labs and studies: ABO, Rh: --/--/PENDING (04/08 3893) Antibody: NEG (04/08 0028) Rubella: 5.75 (09/02 1406) RPR: Non Reactive (01/13 0928)  HBsAg: Negative (09/02 1406)  HIV:    TDS:KAJGOTLX/-- (03/08 1651) 1 hr Glucola  normal Genetic screening normal Anatomy US normal   Past Medical History:  Diagnosis Date  . Allergies   . PCOS (polycystic ovarian syndrome)     Past Surgical History:  Procedure Laterality Date  . NO PAST SURGERIES      OB History  Gravida Para Term Preterm AB Living  1 0 0 0 0 0  SAB IAB Ectopic Multiple Live Births  0 0 0 0 0    # Outcome Date GA Lbr Len/2nd Weight Sex Delivery Anes PTL Lv  1 Current             Social History   Socioeconomic History  . Marital status: Married    Spouse name: Not on file  . Number of children: Not on file  . Years of education: Not on file  . Highest education level: Not on file  Occupational History  . Not on file  Tobacco Use  . Smoking status: Never Smoker  . Smokeless tobacco: Never Used  Vaping Use  . Vaping Use: Never used  Substance and Sexual Activity  . Alcohol use: Not Currently  . Drug use: Not Currently  . Sexual activity: Yes    Comment: contractives  Other Topics Concern  . Not on file  Social History Narrative  . Not on file   Social Determinants of Health   Financial Resource Strain:  Not on file  Food Insecurity: Not on file  Transportation Needs: Not on file  Physical Activity: Not on file  Stress: Not on file  Social Connections: Not on file    Family History  Problem Relation Age of Onset  . Healthy Mother   . Healthy Father   . Congestive Heart Failure Maternal Grandmother   . Diabetes Paternal Grandmother   . Breast cancer Neg Hx   . Ovarian cancer Neg Hx   . Colon cancer Neg Hx     Medications Prior to Admission  Medication Sig Dispense Refill Last Dose  . Prenatal Vit-Fe Fumarate-FA (PRENATAL MULTIVITAMIN) TABS tablet Take 1 tablet by mouth daily at 12 noon.   02/24/2021 at Unknown time  . Probiotic Product (PROBIOTIC-10 PO) Take by mouth.    02/24/2021 at Unknown time    Allergies  Allergen Reactions  . Codeine Hives    Review of Systems: Negative except for what is mentioned in HPI.  Physical Exam: BP 120/85   Pulse 85   Temp 98.4 F (36.9 C) (Oral)   Resp 16   Ht 5\' 3"  (1.6 m)   Wt 77.1 kg   LMP  (LMP Unknown)   BMI 30.11 kg/m  CONSTITUTIONAL: Well-developed, well-nourished female in no acute distress.  HENT:  Normocephalic, atraumatic, External right and left  ear normal. Oropharynx is clear and moist EYES: Conjunctivae and EOM are normal. Pupils are equal, round, and reactive to light. No scleral icterus.  NECK: Normal range of motion, supple, no masses SKIN: Skin is warm and dry. No rash noted. Not diaphoretic. No erythema. No pallor. NEUROLOGIC: Alert and oriented to person, place, and time. Normal reflexes, muscle tone coordination. No cranial nerve deficit noted. PSYCHIATRIC: Normal mood and affect. Normal behavior. Normal judgment and thought content. CARDIOVASCULAR: Normal heart rate noted, regular rhythm RESPIRATORY: Effort and breath sounds normal, no problems with respiration noted ABDOMEN: Soft, nontender, nondistended, gravid. MUSCULOSKELETAL: Normal range of motion. No edema and no tenderness. 2+ distal pulses.  Cervical  Exam: Dilatation 4cm   Effacement 70%   Station 0   Presentation: cephalic FHT:  Baseline rate 145 bpm   Variability moderate  Accelerations present   Decelerations none Contractions: irregular, every 2-4 mins   Pertinent Labs/Studies:   Results for orders placed or performed during the hospital encounter of 02/24/21 (from the past 24 hour(s))  CBC     Status: Abnormal   Collection Time: 02/24/21 12:28 AM  Result Value Ref Range   WBC 10.6 (H) 4.0 - 10.5 K/uL   RBC 4.24 3.87 - 5.11 MIL/uL   Hemoglobin 13.3 12.0 - 15.0 g/dL   HCT 69.6 29.5 - 28.4 %   MCV 91.0 80.0 - 100.0 fL   MCH 31.4 26.0 - 34.0 pg   MCHC 34.5 30.0 - 36.0 g/dL   RDW 13.2 44.0 - 10.2 %   Platelets 172 150 - 400 K/uL   nRBC 0.0 0.0 - 0.2 %  Type and screen     Status: None   Collection Time: 02/24/21 12:28 AM  Result Value Ref Range   ABO/RH(D) A POS    Antibody Screen NEG    Sample Expiration      02/27/2021,2359 Performed at [facility], [address] [place], [place] [address]   Resp Panel by RT-PCR (Flu A&B, Covid) Nasopharyngeal Swab     Status: None   Collection Time: 02/24/21 12:28 AM   Specimen: Nasopharyngeal Swab; Nasopharyngeal(NP) swabs in vial transport medium  Result Value Ref Range   SARS Coronavirus 2 by RT PCR NEGATIVE NEGATIVE   Influenza A by PCR NEGATIVE NEGATIVE   Influenza B by PCR NEGATIVE NEGATIVE  ABO/Rh     Status: None (Preliminary result)   Collection Time: 02/24/21  6:12 AM  Result Value Ref Range   ABO/RH(D) PENDING     Assessment : Earline Stiner is a 30 y.o. G1P0000 at [redacted]w[redacted]d being admitted for induction of labor due to postdates pregnancy.  Plan: Labor: Augmentation with SROM, clear fluid present.  Can start Pitocin if contractions space.  Analgesia as needed. FWB: Reassuring fetal heart tracing.  GBS negative Delivery plan: Hopeful for vaginal delivery   Hildred Laser, MD [facility]

## 2021-02-24 NOTE — Progress Notes (Signed)
Pt requests epidural.  1140 Anesthesia notified 1154 C. Manson Passey, at [facility] 1205 1% local given  1217 Test Dose Maternal HR 80 1220 Bolus #1 1224 Bolus #2 1228 Drip started

## 2021-02-24 NOTE — Anesthesia Preprocedure Evaluation (Signed)
Anesthesia Evaluation  Patient identified by MRN, date of birth, ID band Patient awake    Reviewed: Allergy & Precautions, H&P , NPO status , Patient's Chart, lab work & pertinent test results  Airway Mallampati: I   Neck ROM: full    Dental no notable dental hx.    Pulmonary neg pulmonary ROS,    Pulmonary exam normal        Cardiovascular negative cardio ROS Normal cardiovascular exam     Neuro/Psych negative neurological ROS  negative psych ROS   GI/Hepatic negative GI ROS, Neg liver ROS,   Endo/Other  negative endocrine ROS  Renal/GU negative Renal ROS  negative genitourinary   Musculoskeletal   Abdominal   Peds  Hematology negative hematology ROS (+)   Anesthesia Other Findings   Reproductive/Obstetrics (+) Pregnancy                             Anesthesia Physical Anesthesia Plan  ASA: I  Anesthesia Plan: Epidural   Post-op Pain Management:    Induction:   PONV Risk Score and Plan:   Airway Management Planned:   Additional Equipment:   Intra-op Plan:   Post-operative Plan:   Informed Consent: I have reviewed the patients History and Physical, chart, labs and discussed the procedure including the risks, benefits and alternatives for the proposed anesthesia with the patient or authorized representative who has indicated his/her understanding and acceptance.       Plan Discussed with: Anesthesiologist and CRNA  Anesthesia Plan Comments:         Anesthesia Quick Evaluation

## 2021-02-25 LAB — CBC
HCT: 35.3 % — ABNORMAL LOW (ref 36.0–46.0)
Hemoglobin: 12.2 g/dL (ref 12.0–15.0)
MCH: 31.9 pg (ref 26.0–34.0)
MCHC: 34.6 g/dL (ref 30.0–36.0)
MCV: 92.4 fL (ref 80.0–100.0)
Platelets: 147 10*3/uL — ABNORMAL LOW (ref 150–400)
RBC: 3.82 MIL/uL — ABNORMAL LOW (ref 3.87–5.11)
RDW: 12.8 % (ref 11.5–15.5)
WBC: 17 10*3/uL — ABNORMAL HIGH (ref 4.0–10.5)
nRBC: 0 % (ref 0.0–0.2)

## 2021-02-25 NOTE — Progress Notes (Signed)
Post Partum Day # 1, s/p SVD  Subjective: no complaints, up ad lib, voiding and tolerating PO  Objective: Temp:  [97.7 F (36.5 C)-99.1 F (37.3 C)] 97.8 F (36.6 C) (04/09 1202) Pulse Rate:  [66-87] 80 (04/09 1202) Resp:  [16-20] 18 (04/09 1202) BP: (107-126)/(65-81) 115/79 (04/09 1202) SpO2:  [97 %-100 %] 100 % (04/09 1202)  Physical Exam:  General: alert and no distress  Lungs: clear to auscultation bilaterally Breasts: normal appearance, no masses or tenderness Heart: regular rate and rhythm, S1, S2 normal, no murmur, click, rub or gallop Abdomen: soft, non-tender; bowel sounds normal; no masses,  no organomegaly Pelvis: Lochia: appropriate, Uterine Fundus: firm Extremities: DVT Evaluation: No evidence of DVT seen on physical exam. Negative Homan's sign. No cords or calf tenderness. No significant calf/ankle edema.  Recent Labs    02/24/21 0028 02/25/21 0551  HGB 13.3 12.2  HCT 38.6 35.3*    Assessment/Plan: Doing well postpartum Breastfeeding, Lactation consult  Contraception: condoms Plan for discharge tomorrow    LOS: 1 day   Hildred Laser, MD [facility] 02/25/2021 12:19 PM

## 2021-02-25 NOTE — Progress Notes (Addendum)
Post Partum Day 1 Subjective:  Patient sitting on side of bed, moving around room. Infant in fathers arms resting comfortably. Reports pain under control with medication. no complaints, up ad lib, voiding, tolerating PO and + flatus   Denies difficulty breathing, respiratory distress, chest pain, excessive vaginal bleeding, and leg pain or swelling  Objective:  Blood pressure 115/83, pulse 71, temperature 97.8 F (36.6 C), temperature source Oral, resp. rate 18, height 5\' 3"  (1.6 m), weight 77.1 kg, SpO2 100 %, unknown if currently breastfeeding.  Physical Exam:   General: alert, cooperative and no distress Lochia: appropriate Uterine Fundus: firm Incision: n/a DVT Evaluation: No evidence of DVT seen on physical exam. Negative Homan's sign. No cords or calf tenderness. No significant calf/ankle edema.  Recent Labs    02/24/21 0028 02/25/21 0551  HGB 13.3 12.2  HCT 38.6 35.3*    Assessment:  Postpartum spontaeous vaginal delivery  Lactating mother  Plan:  Plan for discharge tomorrow, Breastfeeding and Lactation consult as needed  04/27/21, Student-MidWife Juliann Pares 02/25/2021, 4:54 PM      I have seen, interviewed, and examined the patient in conjunction with the [facility] 04/27/2021 and affirm the diagnosis and management plan.   [employer], CNM [facility], [facility]

## 2021-02-25 NOTE — Lactation Note (Addendum)
This note was copied from a baby's chart. Lactation Consultation Note  Patient Name: Vanessa Cordova Today's Date: 02/25/2021 Reason for consult: Initial assessment;Primapara;1st time breastfeeding;Term Age:28 hours  Initial visit to 21 hours old infant of a P1 mother. Infant is sleeping in father's arms upon arrival. Mother states breastfeeding is going well but infant fall sleep within 5 minutes. Mother explains she was shown hand expression and is using it to latch.    Plan: 1-Skin to skin 2-Aim for a deep, comfortable latch 3-Breastfeeding on demand or 8-12 times in 24h period. 4-Keep infant awake during breastfeeding session: massaging breast, infant's hand/shoulder/feet 5-Monitor voids and stools as signs good intake.  6-Encouraged maternal rest, hydration and food intake.  7-Contact LC as needed for feeds/support/concerns/questions   All questions answered at this time. Promoted INJoy booklet information.   Maternal Data Has patient been taught Hand Expression?: Yes Does the patient have breastfeeding experience prior to this delivery?: No  Feeding Mother's Current Feeding Choice: Breast Milk  Interventions Interventions: Breast feeding basics reviewed;Skin to skin;Hand express;Breast massage;Expressed milk;Education;DEBP  Discharge Pump: Personal WIC Program: No  Consult Status Consult Status: Follow-up Date: 02/26/21 Follow-up type: In-patient    Vanessa Cordova 02/25/2021, 4:27 PM

## 2021-02-25 NOTE — Anesthesia Postprocedure Evaluation (Signed)
Anesthesia Post Note  Patient: Vanessa Cordova  Procedure(s) Performed: AN AD HOC LABOR EPIDURAL  Patient location during evaluation: Mother Baby Anesthesia Type: Epidural Level of consciousness: awake and alert Pain management: pain level controlled Vital Signs Assessment: post-procedure vital signs reviewed and stable Respiratory status: spontaneous breathing, nonlabored ventilation and respiratory function stable Cardiovascular status: stable Postop Assessment: no headache, no backache and epidural receding Anesthetic complications: no   No complications documented.   Last Vitals:  Vitals:   02/25/21 0337 02/25/21 0810  BP: 114/81 107/76  Pulse: 66 72  Resp: 18 18  Temp: 36.8 C 36.8 C  SpO2: 100% 98%    Last Pain:  Vitals:   02/25/21 0810  TempSrc: Oral  PainSc:                  Corinda Gubler

## 2021-02-26 ENCOUNTER — Ambulatory Visit: Payer: Self-pay

## 2021-02-26 MED ORDER — IBUPROFEN 600 MG PO TABS: 600.0000 mg | ORAL_TABLET | Freq: Four times a day (QID) | ORAL | 0 refills | Status: DC | PRN

## 2021-02-26 NOTE — Discharge Summary (Signed)
Postpartum Discharge Summary     Patient Name: Vanessa Cordova DOB: 1991-09-01 MRN: [id number]  Date of admission: 02/24/2021 Delivery date:02/24/2021  Delivering provider: Rubie Maid  Date of discharge: 02/26/2021  Admitting diagnosis: Post-dates pregnancy [O48.0] Intrauterine pregnancy: [redacted]w[redacted]d    Secondary diagnosis:  Active Problems:   Post-dates pregnancy  Additional problems: History of pituitary tumor, hyperprolactinemia    Discharge diagnosis: Term Pregnancy Delivered                                              Post partum procedures:None Augmentation: AROM and Cytotec Complications: None  Hospital course: Induction of Labor With Vaginal Delivery   30y.o. yo G1P1001 at 452w5das admitted to the hospital 02/24/2021 for induction of labor.  Indication for induction: Postdates.  Patient had an uncomplicated labor course as follows: Membrane Rupture Time/Date: 7:31 AM ,02/24/2021   Delivery Method:Vaginal, Spontaneous  Episiotomy: None  Lacerations:  2nd degree;Perineal  Details of delivery can be found in separate delivery note.  Patient had a routine postpartum course. Patient is discharged home 02/26/21.  Newborn Data: Birth date:02/24/2021  Birth time:7:26 PM  Gender:Female  Living status:Living  Apgars:8 ,9  Weight:3500 g   Magnesium Sulfate received: No BMZ received: No Rhophylac:No MMR:N/A , up to date T-DaP:Given prenatally Flu: No, declined Transfusion:No  Physical exam  Vitals:   02/25/21 1202 02/25/21 1643 02/25/21 2016 02/25/21 2305  BP: 115/79 115/83 114/69 126/88  Pulse: 80 71 72 74  Resp: _0 Temp: 97.8 F (36.6 C) 97.8 F (36.6 C) 98.1 F (36.7 C) 97.6 F (36.4 C)  TempSrc: Oral Oral Oral Oral  SpO2: 100% 100% 98% 100%  Weight:      Height:       General: alert, cooperative and no distress Lochia: appropriate Uterine Fundus: firm Incision: N/A DVT Evaluation: No evidence of DVT seen on physical exam. Negative Homan's  sign. No cords or calf tenderness. No significant calf/ankle edema. Labs: Lab Results  Component Value Date   WBC 17.0 (H) 02/25/2021   HGB 12.2 02/25/2021   HCT 35.3 (L) 02/25/2021   MCV 92.4 02/25/2021   PLT 147 (L) 02/25/2021   No flowsheet data found. Edinburgh Score: Edinburgh Postnatal Depression Scale Screening Tool 02/25/2021  I have been able to laugh and see the funny side of things. 0  I have looked forward with enjoyment to things. 0  I have blamed myself unnecessarily when things went wrong. 2  I have been anxious or worried for no good reason. 0  I have felt scared or panicky for no good reason. 0  Things have been getting on top of me. 0  I have been so unhappy that I have had difficulty sleeping. 0  I have felt sad or miserable. 0  I have been so unhappy that I have been crying. 0  The thought of harming myself has occurred to me. 0  Edinburgh Postnatal Depression Scale Total 2      After visit meds:  Allergies as of 02/26/2021      Reactions   Codeine Hives      Medication List    TAKE these medications   ibuprofen 600 MG tablet Commonly known as: ADVIL Take 1 tablet (600 mg total) by mouth every 6 (six) hours as needed.   prenatal multivitamin Tabs  tablet Take 1 tablet by mouth daily at 12 noon.   PROBIOTIC-10 PO Take by mouth.        Discharge home in stable condition Infant Feeding: Breast Infant Disposition:home with mother Discharge instruction: per After Visit Summary and Postpartum booklet. Activity: Advance as tolerated. Pelvic rest for 6 weeks.  Diet: routine diet Anticipated Birth Control: Condoms Postpartum Appointment:6 weeks Additional Postpartum F/U: Postpartum Depression checkup in 2 weeks Future Appointments: Future Appointments  Date Time Provider [place]  03/09/2021  4:15 PM Rubie Maid, MD [facility] None  04/06/2021  9:45 AM Rubie Maid, MD [facility] None   Follow up Visit:  Follow-up Information    Rubie Maid, MD Follow up in 6 week(s).   Specialties: Obstetrics and Gynecology, Radiology Why: Postpartum visit Contact information: [address] [place] [address] [phone]                   02/26/2021 Rubie Maid, MD

## 2021-02-26 NOTE — Lactation Note (Signed)
This note was copied from a baby's chart. Lactation Consultation Note  Patient Name: Vanessa Cordova Today's Date: 02/26/2021   Age:28 hours Lactation Rounds: LC to the room for a visit. Mother and baby are resting. Mother stated it had been about 3 hours and she was about to feed. Mother stated baby was snorty overnight, not latching and she was spoon feeding colostrum by hand expression. LC discussed age appropriate volumes and we will see what we can do to get baby feeding again at breast. attempted latch in cross cradle and football hold. Taught HE/spoon feeding . Baby began to wake after taking colostrum and was cueing more.Placed 20 mm nipple shield on the right side, baby is now latching but is sluggish to feed and sleepy. LC encouraged since baby is well into day 2 and turning day 3 tonight LC would encourage supplement. Discussed how to supplement at breast and with what. Parents chose formula via SNS. Baby took 37ml's well and showed signs she was satiated after feed. Parents were encouraged to run the next feed by themselves and to call out for any help. Mother was encouraged to start pumping at least 8 times in 24 hours and to hand express after pumping.   LC reviewed and encouraged feeding on demand and with cues. Reviewed diaper counts for days of life and when to call Peds with questions. Reviewed "understanding Postpartum and Newborn care " booklet at bedside. Reviewed outpatient Lactation number and resources. Encouraged downloading an app for tracking feeds and diapers. Reviewed pacifier, and bottles are not encouraged until breastfeeding is established and going well in the first 4 weeks. Parents stated understanding with all teaching.    Darrick Greenlaw D Ogechi Kuehnel 02/26/2021, 5:19 PM

## 2021-02-26 NOTE — Progress Notes (Signed)
Pt discharged with infant.  Discharge instructions, prescriptions and follow up appointment given to and reviewed with pt. Pt verbalized understanding. Escorted out by auxillary. 

## 2021-03-06 ENCOUNTER — Ambulatory Visit: Payer: Self-pay

## 2021-03-09 ENCOUNTER — Ambulatory Visit (INDEPENDENT_AMBULATORY_CARE_PROVIDER_SITE_OTHER): Payer: BC Managed Care – PPO | Admitting: Obstetrics and Gynecology

## 2021-03-09 ENCOUNTER — Encounter: Payer: Self-pay | Admitting: Obstetrics and Gynecology

## 2021-03-09 DIAGNOSIS — Z1389 Encounter for screening for other disorder: Secondary | ICD-10-CM

## 2021-03-09 NOTE — Progress Notes (Signed)
Virtual Visit via Telephone Note  I connected with Vanessa Cordova on 03/09/21 at  4:15 PM EDT by telephone and verified that I am speaking with the correct person using two identifiers.  Location: Patient: Home Provider: Office   I discussed the limitations, risks, security and privacy concerns of performing an evaluation and management service by telephone and the availability of in person appointments. I also discussed with the patient that there may be a patient responsible charge related to this service. The patient expressed understanding and agreed to proceed.   History of Present Illness: Vanessa Cordova is a 29 y.o. G1P1001 who presents for 2 week postpartum check.  She is s/p vaginal delivery.  Currently breastfeeding, denies any major issues. Did initially have issues with latching but now doing well after working with Advertising copywriter.  Notes bleeding alternates heavy and light, but is turning dark red/brown.  Has not resumed intercourse. Desires condoms for contraception. EPDS =1.     Observations/Objective:  Height 5\' 3"  (1.6 m), weight 154 lb (69.9 kg), currently breastfeeding.   Assessment and Plan:  Lactating mother Postpartum state s/p vaginal delivery  Follow Up Instructions: Follow up in 4 weeks for postpartum visit.    I discussed the assessment and treatment plan with the patient. The patient was provided an opportunity to ask questions and all were answered. The patient agreed with the plan and demonstrated an understanding of the instructions.   The patient was advised to call back or seek an in-person evaluation if the symptoms worsen or if the condition fails to improve as anticipated.  I provided 8 minutes of non-face-to-face time during this encounter.   , MD

## 2021-03-09 NOTE — Progress Notes (Signed)
TELEVISIT-PP 2 week mood check.  Weight provided by pt bp unable to document.  Breastfeeding-yes; no issues Sex-no Birth control-condoms Pt stated that she was doing well.

## 2021-03-23 ENCOUNTER — Telehealth: Payer: Self-pay

## 2021-03-23 NOTE — Telephone Encounter (Signed)
No answer LM via VM. Informed pt that her husband's fmla forms had been completed and I was unable to fax them due to the number is not working. Wanted to know if husband would like to come by the office to pick up a copy. Pt was advised to please contact the office as soon as possible.

## 2021-04-06 ENCOUNTER — Other Ambulatory Visit: Payer: Self-pay

## 2021-04-06 ENCOUNTER — Ambulatory Visit (INDEPENDENT_AMBULATORY_CARE_PROVIDER_SITE_OTHER): Payer: BC Managed Care – PPO | Admitting: Obstetrics and Gynecology

## 2021-04-06 ENCOUNTER — Encounter: Payer: Self-pay | Admitting: Obstetrics and Gynecology

## 2021-04-06 DIAGNOSIS — Z3009 Encounter for other general counseling and advice on contraception: Secondary | ICD-10-CM

## 2021-04-06 DIAGNOSIS — Z1331 Encounter for screening for depression: Secondary | ICD-10-CM

## 2021-04-06 NOTE — Progress Notes (Signed)
Subjective:    Vanessa Cordova is a 30 y.o. G5P1001 Caucasian female who presents for a postpartum visit. She is 6 weeks postpartum following a spontaneous vaginal delivery at 40w 5d gestation.  Anesthesia: epidural.   I have fully reviewed the prenatal and intrapartum course. Postpartum course has been uncomplicated. Baby's course has been uncomplicated. Baby is feeding by breast/pumped breast milk.   Patient reports having a clogged duct twice but was able to relieve it with pumping, no further issues.  Bleeding no bleeding. Bowel function is normal. Bladder function is normal.   Patient is not sexually active. Contraception method is condoms.   Posatpartum depression screening: negative. Score 0.    Last pap 08/10/2020 and was Neg.  Patient's last menstrual period was 03/23/2021 (approximate).  Denies difficulty breathing or respiratory distress, chest pain, constipation, vaginal bleeding or clots, breast tenderness or redness or painful areas, and/or leg pain or swelling.  The following portions of the patient's history were reviewed and updated as appropriate: allergies, current medications, past medical history, past surgical history and problem list.  Review of Systems Pertinent items are noted in HPI.   Objective:   BP 118/81   Pulse 83   Ht 5\' 3"  (1.6 m)   Wt 73 kg   LMP 03/23/2021 (Approximate)   Breastfeeding Yes   BMI 28.50 kg/m   General:  alert, cooperative and no distress   Breasts:  deferred, no complaints  Lungs: clear to auscultation bilaterally  Heart:  regular rate and rhythm  Abdomen: soft, nontender   Vulva: normal  Vagina: normal vagina  Cervix:  closed  Corpus: Well-involuted  Adnexa:  Non-palpable      Edinburgh Postnatal Depression Scale Screening Tool 04/06/2021 03/09/2021 02/25/2021  I have been able to laugh and see the funny side of things. 0 0 0  I have looked forward with enjoyment to things. 0 0 0  I have blamed myself unnecessarily  when things went wrong. 0 0 2  I have been anxious or worried for no good reason. 0 0 0  I have felt scared or panicky for no good reason. 0 0 0  Things have been getting on top of me. 0 0 0  I have been so unhappy that I have had difficulty sleeping. 0 0 0  I have felt sad or miserable. 0 0 0  I have been so unhappy that I have been crying. 0 1 0  The thought of harming myself has occurred to me. 0 0 0  Edinburgh Postnatal Depression Scale Total 0 1 2     Labs:  Lab Results  Component Value Date   WBC 17.0 (H) 02/25/2021   HGB 12.2 02/25/2021   HCT 35.3 (L) 02/25/2021   MCV 92.4 02/25/2021   PLT 147 (L) 02/25/2021    Assessment:   Postpartum exam  6 wks s/p vaginal delivery  Breastfeeding  Depression screening  Contraception counseling   Plan:   Discussed birth control options, reviewed cycle tracking and how periods can be irregular with breastfeeding, patient verbalized understanding.  Contraception: condoms  Discussed the recommendation of child spacing of 2 years, patient verbalized understanding.  Reviewed red flag symptoms, signs of mastitis, and when to call.  RTC x 4-6 months for ANNUAL EXAM with Dr. 04/27/2021 or sooner if needed.  Valentino Saxon, Student-MidWife [employer] 04/06/21 10:40 AM

## 2021-04-06 NOTE — Progress Notes (Signed)
Pt present for postpartum care following vaginal delivery. Pt is presently breastfeeding and will be using condoms for birth control. EPDS=0.

## 2021-04-06 NOTE — Patient Instructions (Signed)
Preventive Care 21-28 Years Old, Female Preventive care refers to lifestyle choices and visits with your health care provider that can promote health and wellness. This includes:  A yearly physical exam. This is also called an annual wellness visit.  Regular dental and eye exams.  Immunizations.  Screening for certain conditions.  Healthy lifestyle choices, such as: ? Eating a healthy diet. ? Getting regular exercise. ? Not using drugs or products that contain nicotine and tobacco. ? Limiting alcohol use. What can I expect for my preventive care visit? Physical exam Your health care provider may check your:  Height and weight. These may be used to calculate your BMI (body mass index). BMI is a measurement that tells if you are at a healthy weight.  Heart rate and blood pressure.  Body temperature.  Skin for abnormal spots. Counseling Your health care provider may ask you questions about your:  Past medical problems.  Family's medical history.  Alcohol, tobacco, and drug use.  Emotional well-being.  Home life and relationship well-being.  Sexual activity.  Diet, exercise, and sleep habits.  Work and work environment.  Access to firearms.  Method of birth control.  Menstrual cycle.  Pregnancy history. What immunizations do I need? Vaccines are usually given at various ages, according to a schedule. Your health care provider will recommend vaccines for you based on your age, medical history, and lifestyle or other factors, such as travel or where you work.   What tests do I need? Blood tests  Lipid and cholesterol levels. These may be checked every 5 years starting at age 20.  Hepatitis C test.  Hepatitis B test. Screening  Diabetes screening. This is done by checking your blood sugar (glucose) after you have not eaten for a while (fasting).  STD (sexually transmitted disease) testing, if you are at risk.  BRCA-related cancer screening. This may be  done if you have a family history of breast, ovarian, tubal, or peritoneal cancers.  Pelvic exam and Pap test. This may be done every 3 years starting at age 21. Starting at age 30, this may be done every 5 years if you have a Pap test in combination with an HPV test. Talk with your health care provider about your test results, treatment options, and if necessary, the need for more tests.   Follow these instructions at home: Eating and drinking  Eat a healthy diet that includes fresh fruits and vegetables, whole grains, lean protein, and low-fat dairy products.  Take vitamin and mineral supplements as recommended by your health care provider.  Do not drink alcohol if: ? Your health care provider tells you not to drink. ? You are pregnant, may be pregnant, or are planning to become pregnant.  If you drink alcohol: ? Limit how much you have to 0-1 drink a day. ? Be aware of how much alcohol is in your drink. In the [place] one drink equals one 12 oz bottle of beer (355 mL), one 5 oz glass of wine (148 mL), or one 1 oz glass of hard liquor (44 mL).   Lifestyle  Take daily care of your teeth and gums. Brush your teeth every morning and night with fluoride toothpaste. Floss one time each day.  Stay active. Exercise for at least 30 minutes 5 or more days each week.  Do not use any products that contain nicotine or tobacco, such as cigarettes, e-cigarettes, and chewing tobacco. If you need help quitting, ask your health care provider.  Do not   use drugs.  If you are sexually active, practice safe sex. Use a condom or other form of protection to prevent STIs (sexually transmitted infections).  If you do not wish to become pregnant, use a form of birth control. If you plan to become pregnant, see your health care provider for a prepregnancy visit.  Find healthy ways to cope with stress, such as: ? Meditation, yoga, or listening to music. ? Journaling. ? Talking to a trusted  person. ? Spending time with friends and family. Safety  Always wear your seat belt while driving or riding in a vehicle.  Do not drive: ? If you have been drinking alcohol. Do not ride with someone who has been drinking. ? When you are tired or distracted. ? While texting.  Wear a helmet and other protective equipment during sports activities.  If you have firearms in your house, make sure you follow all gun safety procedures.  Seek help if you have been physically or sexually abused. What's next?  Go to your health care provider once a year for an annual wellness visit.  Ask your health care provider how often you should have your eyes and teeth checked.  Stay up to date on all vaccines. This information is not intended to replace advice given to you by your health care provider. Make sure you discuss any questions you have with your health care provider. Document Revised: 07/03/2020 Document Reviewed: 07/17/2018 Elsevier Patient Education  2021 Elsevier Inc.  

## 2021-07-11 ENCOUNTER — Other Ambulatory Visit: Payer: Self-pay

## 2021-07-11 ENCOUNTER — Ambulatory Visit (LOCAL_COMMUNITY_HEALTH_CENTER): Payer: Self-pay

## 2021-07-11 DIAGNOSIS — Z111 Encounter for screening for respiratory tuberculosis: Secondary | ICD-10-CM

## 2021-07-14 ENCOUNTER — Ambulatory Visit (LOCAL_COMMUNITY_HEALTH_CENTER): Payer: BC Managed Care – PPO

## 2021-07-14 DIAGNOSIS — Z111 Encounter for screening for respiratory tuberculosis: Secondary | ICD-10-CM

## 2021-07-14 LAB — TB SKIN TEST
Induration: 0 mm
TB Skin Test: NEGATIVE

## 2021-09-12 NOTE — Progress Notes (Signed)
GYNECOLOGY ANNUAL PHYSICAL EXAM PROGRESS NOTE  Subjective:    Vanessa Cordova is a 30 y.o. G46P1001 female who presents for an annual exam. The patient has no complaints today. The patient is sexually active. The patient participates in regular exercise: yes. Has the patient ever been transfused or tattooed?: yes. The patient reports that there is not domestic violence in her life.    Gynecologic History:  Menarche age: 62 No LMP recorded (lmp unknown). (Menstrual status: Lactating). Contraception: condoms History of STI's: Denies Last Pap: 08/10/2020. Results were: normal. Notes h/o abnormal pap smears. Last mammogram: Note applicable due to age    Upstream - 09/13/21 1356       Pregnancy Intention Screening   Does the patient want to become pregnant in the next year? No    Does the patient's partner want to become pregnant in the next year? No    Would the patient like to discuss contraceptive options today? No      Contraception Wrap Up   Current Method Female Condom    End Method Female Condom    Contraception Counseling Provided No            The pregnancy intention screening data noted above was reviewed. Potential methods of contraception were discussed. The patient elected to proceed with Female Condom.   OB History  Gravida Para Term Preterm AB Living  1 1 1  0 0 1  SAB IAB Ectopic Multiple Live Births  0 0 0 0 1    # Outcome Date GA Lbr Len/2nd Weight Sex Delivery Anes PTL Lv  1 Term 02/24/21 [redacted]w[redacted]d / 01:30 7 lb 11.5 oz (3.5 kg) F Vag-Spont EPI  LIV     Name: Kneip,GIRL Sharifah     Apgar1: 8  Apgar5: 9    Past Medical History:  Diagnosis Date   Allergies    PCOS (polycystic ovarian syndrome)     Past Surgical History:  Procedure Laterality Date   NO PAST SURGERIES      Family History  Problem Relation Age of Onset   Healthy Mother    Healthy Father    Congestive Heart Failure Maternal Grandmother    Diabetes Paternal Grandmother    Breast  cancer Neg Hx    Ovarian cancer Neg Hx    Colon cancer Neg Hx     Social History   Socioeconomic History   Marital status: Married    Spouse name: Not on file   Number of children: Not on file   Years of education: Not on file   Highest education level: Not on file  Occupational History   Not on file  Tobacco Use   Smoking status: Never   Smokeless tobacco: Never  Vaping Use   Vaping Use: Never used  Substance and Sexual Activity   Alcohol use: Not Currently   Drug use: Not Currently   Sexual activity: Not Currently    Comment: contractives  Other Topics Concern   Not on file  Social History Narrative   Not on file   Social Determinants of Health   Financial Resource Strain: Not on file  Food Insecurity: Not on file  Transportation Needs: Not on file  Physical Activity: Not on file  Stress: Not on file  Social Connections: Not on file  Intimate Partner Violence: Not on file    Current Outpatient Medications on File Prior to Visit  Medication Sig Dispense Refill   Prenatal Vit-Fe Fumarate-FA (PRENATAL MULTIVITAMIN) TABS tablet Take  1 tablet by mouth daily at 12 noon.     Probiotic Product (PROBIOTIC-10 PO) Take by mouth.      No current facility-administered medications on file prior to visit.    Allergies  Allergen Reactions   Codeine Hives     Review of Systems Constitutional: negative for chills, fatigue, fevers and sweats Eyes: negative for irritation, redness and visual disturbance Ears, nose, mouth, throat, and face: negative for hearing loss, nasal congestion, snoring and tinnitus Respiratory: negative for asthma, cough, sputum Cardiovascular: negative for chest pain, dyspnea, exertional chest pressure/discomfort, irregular heart beat, palpitations and syncope Gastrointestinal: negative for abdominal pain, change in bowel habits, nausea and vomiting Genitourinary: negative for abnormal menstrual periods, genital lesions, sexual problems and vaginal  discharge, dysuria and urinary incontinence Integument/breast: negative for breast lump, breast tenderness and nipple discharge Hematologic/lymphatic: negative for bleeding and easy bruising Musculoskeletal:negative for back pain and muscle weakness Neurological: negative for dizziness, headaches, vertigo and weakness Endocrine: negative for diabetic symptoms including polydipsia, polyuria and skin dryness Allergic/Immunologic: negative for hay fever and urticaria      Objective:  Blood pressure 114/76, pulse 75, resp. rate 16, height 5\' 3"  (1.6 m), weight 166 lb 11.2 oz (75.6 kg), currently breastfeeding. Body mass index is 29.53 kg/m.   General Appearance:    Alert, cooperative, no distress, appears stated age, overweight  Head:    Normocephalic, without obvious abnormality, atraumatic  Eyes:    PERRL, conjunctiva/corneas clear, EOM's intact, both eyes  Ears:    Normal external ear canals, both ears  Nose:   Nares normal, septum midline, mucosa normal, no drainage or sinus tenderness  Throat:   Lips, mucosa, and tongue normal; teeth and gums normal  Neck:   Supple, symmetrical, trachea midline, no adenopathy; thyroid: no enlargement/tenderness/nodules; no carotid bruit or JVD  Back:     Symmetric, no curvature, ROM normal, no CVA tenderness  Lungs:     Clear to auscultation bilaterally, respirations unlabored  Chest Wall:    No tenderness or deformity   Heart:    Regular rate and rhythm, S1 and S2 normal, no murmur, rub or gallop  Breast Exam:    No tenderness, masses, or nipple abnormality  Abdomen:     Soft, non-tender, bowel sounds active all four quadrants, no masses, no organomegaly.    Genitalia:    Pelvic:external genitalia normal, vagina without lesions, discharge, or tenderness, rectovaginal septum  normal. Cervix normal in appearance, no cervical motion tenderness, no adnexal masses or tenderness.  Uterus normal size, shape, mobile, regular contours, nontender.  Rectal:     Normal external sphincter.  No hemorrhoids appreciated. Internal exam not done.   Extremities:   Extremities normal, atraumatic, no cyanosis or edema  Pulses:   2+ and symmetric all extremities  Skin:   Skin color, texture, turgor normal, no rashes or lesions  Lymph nodes:   Cervical, supraclavicular, and axillary nodes normal  Neurologic:   CNII-XII intact, normal strength, sensation and reflexes throughout   .  Labs:  Lab Results  Component Value Date   WBC 17.0 (H) 02/25/2021   HGB 12.2 02/25/2021   HCT 35.3 (L) 02/25/2021   MCV 92.4 02/25/2021   PLT 147 (L) 02/25/2021    No results found for: CREATININE, BUN, NA, K, CL, CO2  No results found for: ALT, AST, GGT, ALKPHOS, BILITOT  Lab Results  Component Value Date   TSH 1.390 03/01/2020     Assessment:   1. Well woman exam with  routine gynecological exam   2. Overweight (BMI 25.0-29.9)   3. Lactational amenorrhea   4. Need for hepatitis C screening test    Plan:  Blood tests: CBC with diff and Comprehensive metabolic panel. Hepatitis C Ab.  Breast self exam technique reviewed and patient encouraged to perform self-exam monthly. Contraception: condoms.  Discussed healthy lifestyle modifications. Mammogram:  Not applicable due to age Pap smear  up to date . COVID vaccination status: Declined Flu vaccine: Declined.  Follow up in 1 year for annual exam   Hildred Laser, MD [facility]

## 2021-09-13 ENCOUNTER — Encounter: Payer: Self-pay | Admitting: Obstetrics and Gynecology

## 2021-09-13 ENCOUNTER — Ambulatory Visit (INDEPENDENT_AMBULATORY_CARE_PROVIDER_SITE_OTHER): Payer: BC Managed Care – PPO | Admitting: Obstetrics and Gynecology

## 2021-09-13 ENCOUNTER — Other Ambulatory Visit: Payer: Self-pay

## 2021-09-13 VITALS — BP 114/76 | HR 75 | Resp 16 | Ht 63.0 in | Wt 166.7 lb

## 2021-09-13 DIAGNOSIS — N912 Amenorrhea, unspecified: Secondary | ICD-10-CM | POA: Diagnosis not present

## 2021-09-13 DIAGNOSIS — Z1159 Encounter for screening for other viral diseases: Secondary | ICD-10-CM

## 2021-09-13 DIAGNOSIS — Z01419 Encounter for gynecological examination (general) (routine) without abnormal findings: Secondary | ICD-10-CM

## 2021-09-13 DIAGNOSIS — E663 Overweight: Secondary | ICD-10-CM | POA: Diagnosis not present

## 2021-09-15 LAB — CBC
Hematocrit: 43.6 % (ref 34.0–46.6)
Hemoglobin: 14.8 g/dL (ref 11.1–15.9)
MCH: 30.8 pg (ref 26.6–33.0)
MCHC: 33.9 g/dL (ref 31.5–35.7)
MCV: 91 fL (ref 79–97)
Platelets: 208 10*3/uL (ref 150–450)
RBC: 4.8 x10E6/uL (ref 3.77–5.28)
RDW: 11.9 % (ref 11.7–15.4)
WBC: 7 10*3/uL (ref 3.4–10.8)

## 2021-09-15 LAB — COMPREHENSIVE METABOLIC PANEL
ALT: 17 IU/L (ref 0–32)
AST: 20 IU/L (ref 0–40)
Albumin/Globulin Ratio: 2.3 — ABNORMAL HIGH (ref 1.2–2.2)
Albumin: 4.6 g/dL (ref 3.9–5.0)
Alkaline Phosphatase: 66 IU/L (ref 44–121)
BUN/Creatinine Ratio: 16 (ref 9–23)
BUN: 15 mg/dL (ref 6–20)
Bilirubin Total: 0.2 mg/dL (ref 0.0–1.2)
CO2: 25 mmol/L (ref 20–29)
Calcium: 9.2 mg/dL (ref 8.7–10.2)
Chloride: 103 mmol/L (ref 96–106)
Creatinine, Ser: 0.91 mg/dL (ref 0.57–1.00)
Globulin, Total: 2 g/dL (ref 1.5–4.5)
Glucose: 90 mg/dL (ref 70–99)
Potassium: 4 mmol/L (ref 3.5–5.2)
Sodium: 143 mmol/L (ref 134–144)
Total Protein: 6.6 g/dL (ref 6.0–8.5)
eGFR: 88 mL/min/{1.73_m2} (ref >59)

## 2021-09-15 LAB — HEPATITIS C ANTIBODY: Hep C Virus Ab: <0.1 s/co ratio (ref 0.0–0.9)

## 2022-08-30 ENCOUNTER — Encounter: Payer: Self-pay | Admitting: Obstetrics and Gynecology

## 2024-06-15 ENCOUNTER — Ambulatory Visit: Payer: Self-pay

## 2024-06-15 VITALS — BP 134/78 | HR 83 | Ht 63.0 in | Wt 158.2 lb

## 2024-06-15 DIAGNOSIS — D352 Benign neoplasm of pituitary gland: Secondary | ICD-10-CM | POA: Diagnosis not present

## 2024-06-15 DIAGNOSIS — I10 Essential (primary) hypertension: Secondary | ICD-10-CM | POA: Diagnosis not present

## 2024-06-15 DIAGNOSIS — R5382 Chronic fatigue, unspecified: Secondary | ICD-10-CM

## 2024-06-15 DIAGNOSIS — Z8639 Personal history of other endocrine, nutritional and metabolic disease: Secondary | ICD-10-CM | POA: Insufficient documentation

## 2024-06-15 DIAGNOSIS — R5383 Other fatigue: Secondary | ICD-10-CM | POA: Insufficient documentation

## 2024-06-15 NOTE — Progress Notes (Signed)
 New Patient Visit   Physician: Cyniah Gossard A Shawny Borkowski, MD  Patient: Vanessa Cordova   DOB: 20-Oct-1991   34 y.o. Female  MRN: [id number] Visit Date: 06/15/2024   Chief Complaint  Patient presents with   Establish Care   Subjective  Vanessa Cordova is a 33 y.o. female who presents today as a new patient to establish care.   HPI   Patient has a history of prolactinoma for which she has seen endocrinology in the past.  She had elevated prolactin and pregnancy and did have an MRI which showed a 3 mm hypointense enhancing focus in the left aspect of the pituitary gland.  She denies any headaches or changes in vision.  Patient has not had of her hormones checked since 2022.  She is feeling more tired more recently.  She feels that when she wakes up in the morning she is constantly fatigued.  Complaining also of some brain fog.  Patient BMI elevated at 28.03.  Blood pressure slightly elevated to 134/78 but she reports being somewhat anxious in the doctor's office today.  Patient does exercise with walking on a regular basis.  She does follow an anti-inflammatory diet which is healthy but includes no dairy products.  Patient does have a history of iron deficiency anemia.  She notes that she feels tired a regardless of sleep duration.  Denies chest pain or shortness of breath, no dizziness.  G1, P1 female.  Patient has a history of abnormal Pap which led to a colposcopy and an LSIL result.  Her last Pap was a little over a year ago.    Assessment & Plan    #1 pituitary adenoma.  Will put in for repeat MRI for follow-up imaging.  Given her overall fatigue we are checking hormone panel as well.  Referral for follow-up with endocrinology.  2.  Hypertension.  Suspect patient is slightly elevated in office but this may have to do with underlying secondary causes.  Plan to check blood pressure at next visit.  Patient BMI is slightly elevated as well.  3.  Fatigue.  Suspect we may have an issue  with abnormal hormone levels.  Secondary to this will check iron B12 folate.  Patient does have a history of iron deficiency.   Objective  BP 134/78 (BP Location: Left Arm, Patient Position: Sitting, Cuff Size: Normal)   Pulse 83   Ht 5' 3 (1.6 m)   Wt 158 lb 4 oz (71.8 kg)   SpO2 98%   BMI 28.03 kg/m      Review of Systems  Constitutional:  Negative for chills, fever and weight loss.  Eyes:  Negative for blurred vision. h Respiratory:  Negative for cough and shortness of breath.   Cardiovascular:  Negative for chest pain and palpitations.  Skin:  Negative for rash.  Psychiatric/Behavioral:  Negative for depression. The patient is not nervous/anxious.      Physical Exam Physical Exam Vitals reviewed.  Constitutional:      Appearance: Normal appearance. Well-developed with normal weight.  HENT:     Head: Normocephalic and atraumatic.  Normal mucous membranes, no oral lesions Eyes:     Pupils: Pupils are equal, round, and reactive to light.  Neck:     Thyroid: No thyroid mass or thyromegaly.  Cardiovascular:     Rate and Rhythm: Normal rate and regular rhythm. Normal heart sounds. Normal peripheral pulses Pulmonary:     Normal breath sounds with normal effort Abdominal:   Abdomen is soft,  without tenderness or noted hepatosplenomegaly Musculoskeletal:        General: No swelling or edema  Lymphadenopathy:     Cervical: No cervical adenopathy.  Skin:    General: Skin is warm and dry without noticeable rash. Neurological:     General: No focal deficit present.  Psychiatric:        Mood and Affect: Mood, behavior and cognition normal   Past Medical History:  Diagnosis Date   Allergies    Amenorrhea    Elevated prolactin level    PCOS (polycystic ovarian syndrome)    Pituitary microadenoma (HCC)    Past Surgical History:  Procedure Laterality Date   NO PAST SURGERIES     WISDOM TOOTH EXTRACTION     Family Status  Relation Name Status   Mother  Alive    Father  Alive   Brother  Alive   MGM  Alive   MGF  Deceased   PGM  Alive   PGF  Deceased   Neg Hx  (Not Specified)  No partnership data on file   Family History  Problem Relation Age of Onset   Healthy Mother    Alcohol abuse Father    Depression Father    Congestive Heart Failure Maternal Grandmother    Diabetes Paternal Grandmother    Breast cancer Neg Hx    Ovarian cancer Neg Hx    Colon cancer Neg Hx    Social History   Socioeconomic History   Marital status: Married    Spouse name: Not on file   Number of children: Not on file   Years of education: Not on file   Highest education level: Not on file  Occupational History   Not on file  Tobacco Use   Smoking status: Never   Smokeless tobacco: Never  Vaping Use   Vaping status: Never Used  Substance and Sexual Activity   Alcohol use: Never   Drug use: Never   Sexual activity: Yes    Birth control/protection: Condom    Comment: contractives  Other Topics Concern   Not on file  Social History Narrative   Not on file   Social Drivers of Health   Financial Resource Strain: Not on file  Food Insecurity: Not on file  Transportation Needs: Not on file  Physical Activity: Not on file  Stress: Not on file  Social Connections: Not on file   Outpatient Medications Prior to Visit  Medication Sig   Iodine Strong, Lugols, (STRONG IODINE) 5 % solution Take 0.2 mLs by mouth 3 (three) times daily.   Prenatal Vit-Fe Fumarate-FA (PRENATAL MULTIVITAMIN) TABS tablet Take 1 tablet by mouth daily at 12 noon.   Probiotic Product (PROBIOTIC-10 PO) Take by mouth.    No facility-administered medications prior to visit.   Allergies  Allergen Reactions   Codeine Hives    Immunization History  Administered Date(s) Administered   PPD Test 07/11/2021   Tdap 12/01/2020    Health Maintenance  Topic Date Due   Hepatitis B Vaccines (1 of 3 - 19+ 3-dose series) Never done   HPV VACCINES (1 - 3-dose SCDM series) Never done    COVID-19 Vaccine (1 - 2024-25 season) Never done   Cervical Cancer Screening (HPV/Pap Cotest)  08/11/2023   INFLUENZA VACCINE  06/19/2024   DTaP/Tdap/Td (2 - Td or Tdap) 12/01/2030   Hepatitis C Screening  Completed   HIV Screening  Completed   Meningococcal B Vaccine  Aged Out    Patient Care  Team: Dossie Norris, NP (Inactive) as PCP - General (Nurse Practitioner)  Depression Screen    06/15/2024   10:21 AM 09/13/2021    1:56 PM  PHQ 2/9 Scores  PHQ - 2 Score 1 0  PHQ- 9 Score 5      Parris DELENA Juneau, MD  [facility] [phone] (phone) [phone] (fax)  [facility]

## 2024-06-22 ENCOUNTER — Ambulatory Visit: Admission: RE | Admit: 2024-06-22 | Source: Ambulatory Visit

## 2024-07-06 ENCOUNTER — Other Ambulatory Visit

## 2024-07-06 DIAGNOSIS — D352 Benign neoplasm of pituitary gland: Secondary | ICD-10-CM

## 2024-07-06 DIAGNOSIS — I10 Essential (primary) hypertension: Secondary | ICD-10-CM

## 2024-07-06 DIAGNOSIS — Z8639 Personal history of other endocrine, nutritional and metabolic disease: Secondary | ICD-10-CM

## 2024-07-08 LAB — CBC WITH DIFFERENTIAL/PLATELET
Absolute Lymphocytes: 2192 {cells}/uL (ref 850–3900)
Absolute Monocytes: 297 {cells}/uL (ref 200–950)
Basophils Absolute: 38 {cells}/uL (ref 0–200)
Basophils Relative: 0.7 %
Eosinophils Absolute: 59 {cells}/uL (ref 15–500)
Eosinophils Relative: 1.1 %
HCT: 46.3 % — ABNORMAL HIGH (ref 35.0–45.0)
Hemoglobin: 15.1 g/dL (ref 11.7–15.5)
MCH: 30.8 pg (ref 27.0–33.0)
MCHC: 32.6 g/dL (ref 32.0–36.0)
MCV: 94.3 fL (ref 80.0–100.0)
MPV: 11.6 fL (ref 7.5–12.5)
Monocytes Relative: 5.5 %
Neutro Abs: 2813 {cells}/uL (ref 1500–7800)
Neutrophils Relative %: 52.1 %
Platelets: 201 Thousand/uL (ref 140–400)
RBC: 4.91 Million/uL (ref 3.80–5.10)
RDW: 12.3 % (ref 11.0–15.0)
Total Lymphocyte: 40.6 %
WBC: 5.4 Thousand/uL (ref 3.8–10.8)

## 2024-07-08 LAB — LIPID PANEL
Cholesterol: 183 mg/dL (ref <200)
HDL: 52 mg/dL (ref >=50)
LDL Cholesterol (Calc): 116 mg/dL — ABNORMAL HIGH
Non-HDL Cholesterol (Calc): 131 mg/dL — ABNORMAL HIGH (ref <130)
Total CHOL/HDL Ratio: 3.5 (calc) (ref <5.0)
Triglycerides: 63 mg/dL (ref <150)

## 2024-07-08 LAB — COMPREHENSIVE METABOLIC PANEL WITH GFR
AG Ratio: 2.2 (calc) (ref 1.0–2.5)
ALT: 17 U/L (ref 6–29)
AST: 15 U/L (ref 10–30)
Albumin: 4.6 g/dL (ref 3.6–5.1)
Alkaline phosphatase (APISO): 44 U/L (ref 31–125)
BUN: 13 mg/dL (ref 7–25)
CO2: 27 mmol/L (ref 20–32)
Calcium: 9.2 mg/dL (ref 8.6–10.2)
Chloride: 106 mmol/L (ref 98–110)
Creat: 0.8 mg/dL (ref 0.50–0.97)
Globulin: 2.1 g/dL (ref 1.9–3.7)
Glucose, Bld: 97 mg/dL (ref 65–99)
Potassium: 4.3 mmol/L (ref 3.5–5.3)
Sodium: 141 mmol/L (ref 135–146)
Total Bilirubin: 0.7 mg/dL (ref 0.2–1.2)
Total Protein: 6.7 g/dL (ref 6.1–8.1)
eGFR: 101 mL/min/1.73m2 (ref >=60)

## 2024-07-08 LAB — B12 AND FOLATE PANEL
Folate: 13.9 ng/mL
Vitamin B-12: 547 pg/mL (ref 200–1100)

## 2024-07-08 LAB — IRON,TIBC AND FERRITIN PANEL
%SAT: 39 % (ref 16–45)
Ferritin: 34 ng/mL (ref 16–154)
Iron: 98 ng/mL (ref 40–190)
TIBC: 254 ug/dL (ref 250–450)

## 2024-07-08 LAB — TSH+FREE T4: TSH W/REFLEX TO FT4: 0.99 m[IU]/L

## 2024-07-08 LAB — HEMOGLOBIN A1C
Hgb A1c MFr Bld: 5.3 % (ref <5.7)
Mean Plasma Glucose: 105 mg/dL
eAG (mmol/L): 5.8 mmol/L

## 2024-07-08 LAB — PROLACTIN: Prolactin: 18.5 ng/mL

## 2024-07-08 LAB — ACTH: C206 ACTH: 14 pg/mL (ref 6–50)

## 2024-07-08 LAB — CORTISOL: Cortisol, Plasma: 13.3 ug/dL

## 2024-07-22 ENCOUNTER — Ambulatory Visit
